# Patient Record
Sex: Female | Born: 1935
Health system: Southern US, Community
[De-identification: ages and names within clinical notes are randomized; demographics above are authoritative.]

## PROBLEM LIST (undated history)

## (undated) DIAGNOSIS — C801 Malignant (primary) neoplasm, unspecified: Secondary | ICD-10-CM

## (undated) DIAGNOSIS — M810 Age-related osteoporosis without current pathological fracture: Secondary | ICD-10-CM

## (undated) DIAGNOSIS — E039 Hypothyroidism, unspecified: Secondary | ICD-10-CM

## (undated) DIAGNOSIS — R0600 Dyspnea, unspecified: Secondary | ICD-10-CM

## (undated) DIAGNOSIS — E785 Hyperlipidemia, unspecified: Secondary | ICD-10-CM

## (undated) DIAGNOSIS — I10 Essential (primary) hypertension: Secondary | ICD-10-CM

## (undated) HISTORY — PX: EYE SURGERY: SHX253

## (undated) HISTORY — PX: CORNEAL CHELATION: SHX1399

---

## 2004-06-03 ENCOUNTER — Ambulatory Visit: Payer: Self-pay | Admitting: Internal Medicine

## 2005-06-09 ENCOUNTER — Ambulatory Visit: Payer: Self-pay | Admitting: Internal Medicine

## 2006-06-11 ENCOUNTER — Ambulatory Visit: Payer: Self-pay | Admitting: Internal Medicine

## 2007-06-15 ENCOUNTER — Ambulatory Visit: Payer: Self-pay | Admitting: Internal Medicine

## 2009-04-17 ENCOUNTER — Ambulatory Visit: Payer: Self-pay | Admitting: Internal Medicine

## 2010-04-24 ENCOUNTER — Ambulatory Visit: Payer: Self-pay | Admitting: Internal Medicine

## 2010-05-24 ENCOUNTER — Ambulatory Visit: Payer: Self-pay | Admitting: Internal Medicine

## 2010-06-13 ENCOUNTER — Ambulatory Visit: Payer: Self-pay | Admitting: Internal Medicine

## 2011-07-08 ENCOUNTER — Ambulatory Visit: Payer: Self-pay | Admitting: Internal Medicine

## 2012-01-14 ENCOUNTER — Other Ambulatory Visit: Payer: Self-pay | Admitting: Gastroenterology

## 2012-01-14 LAB — CLOSTRIDIUM DIFFICILE BY PCR

## 2012-03-19 ENCOUNTER — Ambulatory Visit: Payer: Self-pay | Admitting: Gastroenterology

## 2012-03-22 LAB — PATHOLOGY REPORT

## 2012-10-12 ENCOUNTER — Ambulatory Visit: Payer: Self-pay

## 2012-11-15 HISTORY — PX: TRIGGER FINGER RELEASE: SHX641

## 2013-09-22 DIAGNOSIS — K52839 Microscopic colitis, unspecified: Secondary | ICD-10-CM | POA: Insufficient documentation

## 2013-10-13 ENCOUNTER — Ambulatory Visit: Payer: Self-pay

## 2014-08-28 ENCOUNTER — Other Ambulatory Visit: Payer: Self-pay | Admitting: Infectious Diseases

## 2014-08-28 DIAGNOSIS — Z1231 Encounter for screening mammogram for malignant neoplasm of breast: Secondary | ICD-10-CM

## 2014-10-17 ENCOUNTER — Ambulatory Visit: Payer: Self-pay

## 2014-10-23 ENCOUNTER — Ambulatory Visit: Payer: Self-pay

## 2014-11-16 ENCOUNTER — Ambulatory Visit
Admission: RE | Admit: 2014-11-16 | Discharge: 2014-11-16 | Disposition: A | Discharge status: Home / Self Care | Payer: Medicare Other | Source: Ambulatory Visit | Attending: Infectious Diseases | Admitting: Infectious Diseases

## 2014-11-16 DIAGNOSIS — Z1231 Encounter for screening mammogram for malignant neoplasm of breast: Secondary | ICD-10-CM | POA: Diagnosis not present

## 2014-11-16 HISTORY — DX: Malignant (primary) neoplasm, unspecified: C80.1

## 2015-12-06 ENCOUNTER — Other Ambulatory Visit: Payer: Self-pay | Admitting: Infectious Diseases

## 2015-12-06 DIAGNOSIS — Z1231 Encounter for screening mammogram for malignant neoplasm of breast: Secondary | ICD-10-CM

## 2015-12-18 ENCOUNTER — Other Ambulatory Visit: Payer: Self-pay | Admitting: Infectious Diseases

## 2015-12-18 ENCOUNTER — Ambulatory Visit
Admission: RE | Admit: 2015-12-18 | Discharge: 2015-12-18 | Disposition: A | Discharge status: Home / Self Care | Payer: Medicare Other | Source: Ambulatory Visit | Attending: Infectious Diseases | Admitting: Infectious Diseases

## 2015-12-18 DIAGNOSIS — Z1231 Encounter for screening mammogram for malignant neoplasm of breast: Secondary | ICD-10-CM

## 2016-05-27 DIAGNOSIS — R300 Dysuria: Secondary | ICD-10-CM | POA: Diagnosis not present

## 2016-06-03 DIAGNOSIS — R300 Dysuria: Secondary | ICD-10-CM | POA: Diagnosis not present

## 2016-06-30 DIAGNOSIS — R1032 Left lower quadrant pain: Secondary | ICD-10-CM | POA: Diagnosis not present

## 2016-06-30 DIAGNOSIS — R1031 Right lower quadrant pain: Secondary | ICD-10-CM | POA: Diagnosis not present

## 2016-07-08 DIAGNOSIS — H401110 Primary open-angle glaucoma, right eye, stage unspecified: Secondary | ICD-10-CM | POA: Diagnosis not present

## 2016-08-25 DIAGNOSIS — K52839 Microscopic colitis, unspecified: Secondary | ICD-10-CM | POA: Diagnosis not present

## 2016-08-25 DIAGNOSIS — I10 Essential (primary) hypertension: Secondary | ICD-10-CM | POA: Diagnosis not present

## 2016-08-25 DIAGNOSIS — E039 Hypothyroidism, unspecified: Secondary | ICD-10-CM | POA: Diagnosis not present

## 2016-08-25 DIAGNOSIS — E7800 Pure hypercholesterolemia, unspecified: Secondary | ICD-10-CM | POA: Diagnosis not present

## 2016-09-01 DIAGNOSIS — R350 Frequency of micturition: Secondary | ICD-10-CM | POA: Diagnosis not present

## 2016-09-01 DIAGNOSIS — R300 Dysuria: Secondary | ICD-10-CM | POA: Diagnosis not present

## 2016-09-01 DIAGNOSIS — R3915 Urgency of urination: Secondary | ICD-10-CM | POA: Diagnosis not present

## 2016-09-01 DIAGNOSIS — R05 Cough: Secondary | ICD-10-CM | POA: Diagnosis not present

## 2016-12-08 DIAGNOSIS — I10 Essential (primary) hypertension: Secondary | ICD-10-CM | POA: Diagnosis not present

## 2016-12-08 DIAGNOSIS — E039 Hypothyroidism, unspecified: Secondary | ICD-10-CM | POA: Diagnosis not present

## 2017-01-05 ENCOUNTER — Other Ambulatory Visit: Payer: Self-pay | Admitting: Infectious Diseases

## 2017-01-05 DIAGNOSIS — Z1231 Encounter for screening mammogram for malignant neoplasm of breast: Secondary | ICD-10-CM

## 2017-01-26 ENCOUNTER — Ambulatory Visit
Admission: RE | Admit: 2017-01-26 | Discharge: 2017-01-26 | Disposition: A | Discharge status: Home / Self Care | Payer: Medicare Other | Source: Ambulatory Visit | Attending: Infectious Diseases | Admitting: Infectious Diseases

## 2017-01-26 DIAGNOSIS — E039 Hypothyroidism, unspecified: Secondary | ICD-10-CM | POA: Diagnosis not present

## 2017-01-26 DIAGNOSIS — I10 Essential (primary) hypertension: Secondary | ICD-10-CM | POA: Diagnosis not present

## 2017-01-26 DIAGNOSIS — Z1231 Encounter for screening mammogram for malignant neoplasm of breast: Secondary | ICD-10-CM

## 2017-02-12 DIAGNOSIS — H401110 Primary open-angle glaucoma, right eye, stage unspecified: Secondary | ICD-10-CM | POA: Diagnosis not present

## 2017-03-03 DIAGNOSIS — E039 Hypothyroidism, unspecified: Secondary | ICD-10-CM | POA: Diagnosis not present

## 2017-03-10 DIAGNOSIS — E7800 Pure hypercholesterolemia, unspecified: Secondary | ICD-10-CM | POA: Diagnosis not present

## 2017-03-10 DIAGNOSIS — I10 Essential (primary) hypertension: Secondary | ICD-10-CM | POA: Diagnosis not present

## 2017-03-10 DIAGNOSIS — Z0000 Encounter for general adult medical examination without abnormal findings: Secondary | ICD-10-CM | POA: Diagnosis not present

## 2017-03-10 DIAGNOSIS — E039 Hypothyroidism, unspecified: Secondary | ICD-10-CM | POA: Diagnosis not present

## 2017-03-19 HISTORY — PX: COLONOSCOPY: SHX174

## 2017-04-28 DIAGNOSIS — K580 Irritable bowel syndrome with diarrhea: Secondary | ICD-10-CM | POA: Diagnosis not present

## 2017-06-02 DIAGNOSIS — K529 Noninfective gastroenteritis and colitis, unspecified: Secondary | ICD-10-CM | POA: Diagnosis not present

## 2017-06-02 DIAGNOSIS — K580 Irritable bowel syndrome with diarrhea: Secondary | ICD-10-CM | POA: Diagnosis not present

## 2017-06-08 ENCOUNTER — Other Ambulatory Visit
Admission: RE | Admit: 2017-06-08 | Discharge: 2017-06-08 | Disposition: A | Discharge status: Home / Self Care | Payer: Medicare Other | Source: Ambulatory Visit | Attending: Gastroenterology | Admitting: Gastroenterology

## 2017-06-08 DIAGNOSIS — K529 Noninfective gastroenteritis and colitis, unspecified: Secondary | ICD-10-CM | POA: Insufficient documentation

## 2017-06-08 DIAGNOSIS — E039 Hypothyroidism, unspecified: Secondary | ICD-10-CM | POA: Diagnosis not present

## 2017-06-08 LAB — GASTROINTESTINAL PANEL BY PCR, STOOL (REPLACES STOOL CULTURE)
ADENOVIRUS F40/41: NOT DETECTED
ASTROVIRUS: NOT DETECTED
CRYPTOSPORIDIUM: NOT DETECTED
Campylobacter species: NOT DETECTED
Cyclospora cayetanensis: NOT DETECTED
ENTEROAGGREGATIVE E COLI (EAEC): NOT DETECTED
ENTEROPATHOGENIC E COLI (EPEC): NOT DETECTED
ENTEROTOXIGENIC E COLI (ETEC): NOT DETECTED
Entamoeba histolytica: NOT DETECTED
GIARDIA LAMBLIA: NOT DETECTED
NOROVIRUS GI/GII: NOT DETECTED
PLESIMONAS SHIGELLOIDES: NOT DETECTED
ROTAVIRUS A: NOT DETECTED
Salmonella species: NOT DETECTED
Sapovirus (I, II, IV, and V): NOT DETECTED
Shiga like toxin producing E coli (STEC): NOT DETECTED
Shigella/Enteroinvasive E coli (EIEC): NOT DETECTED
Vibrio cholerae: NOT DETECTED
Vibrio species: NOT DETECTED
Yersinia enterocolitica: NOT DETECTED

## 2017-06-08 LAB — C DIFFICILE QUICK SCREEN W PCR REFLEX
C DIFFICILE (CDIFF) INTERP: NOT DETECTED
C DIFFICILE (CDIFF) TOXIN: NEGATIVE
C Diff antigen: NEGATIVE

## 2017-06-16 DIAGNOSIS — I7300 Raynaud's syndrome without gangrene: Secondary | ICD-10-CM | POA: Diagnosis not present

## 2017-07-02 DIAGNOSIS — Q872 Congenital malformation syndromes predominantly involving limbs: Secondary | ICD-10-CM | POA: Diagnosis not present

## 2017-07-02 DIAGNOSIS — I7300 Raynaud's syndrome without gangrene: Secondary | ICD-10-CM | POA: Insufficient documentation

## 2017-07-02 DIAGNOSIS — R7982 Elevated C-reactive protein (CRP): Secondary | ICD-10-CM | POA: Diagnosis not present

## 2017-07-21 DIAGNOSIS — R634 Abnormal weight loss: Secondary | ICD-10-CM | POA: Diagnosis not present

## 2017-07-21 DIAGNOSIS — R197 Diarrhea, unspecified: Secondary | ICD-10-CM | POA: Diagnosis not present

## 2017-07-30 ENCOUNTER — Other Ambulatory Visit: Payer: Self-pay | Admitting: Gastroenterology

## 2017-07-30 DIAGNOSIS — R634 Abnormal weight loss: Secondary | ICD-10-CM

## 2017-07-30 DIAGNOSIS — Z87891 Personal history of nicotine dependence: Secondary | ICD-10-CM | POA: Diagnosis not present

## 2017-07-30 DIAGNOSIS — E079 Disorder of thyroid, unspecified: Secondary | ICD-10-CM

## 2017-07-30 DIAGNOSIS — K529 Noninfective gastroenteritis and colitis, unspecified: Secondary | ICD-10-CM | POA: Diagnosis not present

## 2017-08-04 ENCOUNTER — Ambulatory Visit: Payer: Medicare Other

## 2017-08-13 DIAGNOSIS — H401110 Primary open-angle glaucoma, right eye, stage unspecified: Secondary | ICD-10-CM | POA: Diagnosis not present

## 2017-08-20 ENCOUNTER — Ambulatory Visit
Admission: RE | Admit: 2017-08-20 | Discharge: 2017-08-20 | Disposition: A | Discharge status: Home / Self Care | Payer: Medicare Other | Source: Ambulatory Visit | Attending: Gastroenterology | Admitting: Gastroenterology

## 2017-08-20 DIAGNOSIS — N2889 Other specified disorders of kidney and ureter: Secondary | ICD-10-CM | POA: Diagnosis not present

## 2017-08-20 DIAGNOSIS — E079 Disorder of thyroid, unspecified: Secondary | ICD-10-CM

## 2017-08-20 DIAGNOSIS — R918 Other nonspecific abnormal finding of lung field: Secondary | ICD-10-CM | POA: Diagnosis not present

## 2017-08-20 DIAGNOSIS — I2510 Atherosclerotic heart disease of native coronary artery without angina pectoris: Secondary | ICD-10-CM | POA: Diagnosis not present

## 2017-08-20 DIAGNOSIS — J479 Bronchiectasis, uncomplicated: Secondary | ICD-10-CM | POA: Diagnosis not present

## 2017-08-20 DIAGNOSIS — J438 Other emphysema: Secondary | ICD-10-CM | POA: Diagnosis not present

## 2017-08-20 DIAGNOSIS — K529 Noninfective gastroenteritis and colitis, unspecified: Secondary | ICD-10-CM | POA: Insufficient documentation

## 2017-08-20 DIAGNOSIS — R634 Abnormal weight loss: Secondary | ICD-10-CM | POA: Diagnosis not present

## 2017-08-20 DIAGNOSIS — J439 Emphysema, unspecified: Secondary | ICD-10-CM | POA: Diagnosis not present

## 2017-08-20 DIAGNOSIS — R05 Cough: Secondary | ICD-10-CM | POA: Insufficient documentation

## 2017-08-20 DIAGNOSIS — Z87891 Personal history of nicotine dependence: Secondary | ICD-10-CM | POA: Diagnosis not present

## 2017-08-20 DIAGNOSIS — I700 Atherosclerosis of aorta: Secondary | ICD-10-CM | POA: Diagnosis not present

## 2017-08-20 DIAGNOSIS — J432 Centrilobular emphysema: Secondary | ICD-10-CM | POA: Diagnosis not present

## 2017-08-20 DIAGNOSIS — R197 Diarrhea, unspecified: Secondary | ICD-10-CM | POA: Diagnosis not present

## 2017-08-20 HISTORY — DX: Essential (primary) hypertension: I10

## 2017-08-20 MED ORDER — IOPAMIDOL (ISOVUE-300) INJECTION 61%
75.0000 mL | Freq: Once | INTRAVENOUS | Status: AC | PRN
  Administered 2017-08-20: 75 mL via INTRAVENOUS

## 2017-08-25 ENCOUNTER — Other Ambulatory Visit: Payer: Self-pay | Admitting: Gastroenterology

## 2017-08-25 DIAGNOSIS — N289 Disorder of kidney and ureter, unspecified: Secondary | ICD-10-CM

## 2017-08-27 ENCOUNTER — Ambulatory Visit
Admission: RE | Admit: 2017-08-27 | Discharge: 2017-08-27 | Disposition: A | Discharge status: Home / Self Care | Payer: Medicare Other | Source: Ambulatory Visit | Attending: Gastroenterology | Admitting: Gastroenterology

## 2017-08-27 ENCOUNTER — Encounter (INDEPENDENT_AMBULATORY_CARE_PROVIDER_SITE_OTHER): Payer: Self-pay

## 2017-08-27 DIAGNOSIS — N289 Disorder of kidney and ureter, unspecified: Secondary | ICD-10-CM | POA: Insufficient documentation

## 2017-08-27 DIAGNOSIS — N2889 Other specified disorders of kidney and ureter: Secondary | ICD-10-CM | POA: Diagnosis not present

## 2017-08-27 MED ORDER — GADOBENATE DIMEGLUMINE 529 MG/ML IV SOLN
8.0000 mL | Freq: Once | INTRAVENOUS | Status: AC | PRN
  Administered 2017-08-27: 8 mL via INTRAVENOUS

## 2017-09-04 DIAGNOSIS — I10 Essential (primary) hypertension: Secondary | ICD-10-CM | POA: Diagnosis not present

## 2017-09-04 DIAGNOSIS — E7800 Pure hypercholesterolemia, unspecified: Secondary | ICD-10-CM | POA: Diagnosis not present

## 2017-09-04 DIAGNOSIS — E039 Hypothyroidism, unspecified: Secondary | ICD-10-CM | POA: Diagnosis not present

## 2017-09-10 DIAGNOSIS — R634 Abnormal weight loss: Secondary | ICD-10-CM | POA: Diagnosis not present

## 2017-09-10 DIAGNOSIS — R197 Diarrhea, unspecified: Secondary | ICD-10-CM | POA: Diagnosis not present

## 2017-09-10 DIAGNOSIS — R194 Change in bowel habit: Secondary | ICD-10-CM | POA: Diagnosis not present

## 2017-09-11 DIAGNOSIS — J479 Bronchiectasis, uncomplicated: Secondary | ICD-10-CM | POA: Diagnosis not present

## 2017-09-11 DIAGNOSIS — R9389 Abnormal findings on diagnostic imaging of other specified body structures: Secondary | ICD-10-CM | POA: Diagnosis not present

## 2017-09-11 DIAGNOSIS — E039 Hypothyroidism, unspecified: Secondary | ICD-10-CM | POA: Diagnosis not present

## 2017-09-11 DIAGNOSIS — K52839 Microscopic colitis, unspecified: Secondary | ICD-10-CM | POA: Diagnosis not present

## 2017-09-18 DIAGNOSIS — R9389 Abnormal findings on diagnostic imaging of other specified body structures: Secondary | ICD-10-CM | POA: Diagnosis not present

## 2017-09-18 DIAGNOSIS — J479 Bronchiectasis, uncomplicated: Secondary | ICD-10-CM | POA: Diagnosis not present

## 2017-09-22 ENCOUNTER — Ambulatory Visit: Payer: Medicare Other | Admitting: Anesthesiology

## 2017-09-22 ENCOUNTER — Encounter
Admission: RE | Disposition: A | Discharge status: Home / Self Care | Payer: Self-pay | Source: Ambulatory Visit | Attending: Gastroenterology

## 2017-09-22 ENCOUNTER — Ambulatory Visit
Admission: RE | Admit: 2017-09-22 | Discharge: 2017-09-22 | Disposition: A | Discharge status: Home / Self Care | Payer: Medicare Other | Source: Ambulatory Visit | Attending: Gastroenterology | Admitting: Gastroenterology

## 2017-09-22 ENCOUNTER — Encounter: Payer: Self-pay | Admitting: *Deleted

## 2017-09-22 DIAGNOSIS — K2960 Other gastritis without bleeding: Secondary | ICD-10-CM | POA: Diagnosis not present

## 2017-09-22 DIAGNOSIS — K2210 Ulcer of esophagus without bleeding: Secondary | ICD-10-CM | POA: Insufficient documentation

## 2017-09-22 DIAGNOSIS — E7800 Pure hypercholesterolemia, unspecified: Secondary | ICD-10-CM | POA: Insufficient documentation

## 2017-09-22 DIAGNOSIS — Q872 Congenital malformation syndromes predominantly involving limbs: Secondary | ICD-10-CM | POA: Insufficient documentation

## 2017-09-22 DIAGNOSIS — K648 Other hemorrhoids: Secondary | ICD-10-CM | POA: Insufficient documentation

## 2017-09-22 DIAGNOSIS — E039 Hypothyroidism, unspecified: Secondary | ICD-10-CM | POA: Insufficient documentation

## 2017-09-22 DIAGNOSIS — K449 Diaphragmatic hernia without obstruction or gangrene: Secondary | ICD-10-CM | POA: Insufficient documentation

## 2017-09-22 DIAGNOSIS — Z9849 Cataract extraction status, unspecified eye: Secondary | ICD-10-CM | POA: Diagnosis not present

## 2017-09-22 DIAGNOSIS — K644 Residual hemorrhoidal skin tags: Secondary | ICD-10-CM | POA: Diagnosis not present

## 2017-09-22 DIAGNOSIS — Z882 Allergy status to sulfonamides status: Secondary | ICD-10-CM | POA: Insufficient documentation

## 2017-09-22 DIAGNOSIS — E785 Hyperlipidemia, unspecified: Secondary | ICD-10-CM | POA: Insufficient documentation

## 2017-09-22 DIAGNOSIS — M810 Age-related osteoporosis without current pathological fracture: Secondary | ICD-10-CM | POA: Diagnosis not present

## 2017-09-22 DIAGNOSIS — Z79899 Other long term (current) drug therapy: Secondary | ICD-10-CM | POA: Diagnosis not present

## 2017-09-22 DIAGNOSIS — K210 Gastro-esophageal reflux disease with esophagitis: Secondary | ICD-10-CM | POA: Diagnosis not present

## 2017-09-22 DIAGNOSIS — K52831 Collagenous colitis: Secondary | ICD-10-CM | POA: Insufficient documentation

## 2017-09-22 DIAGNOSIS — R197 Diarrhea, unspecified: Secondary | ICD-10-CM | POA: Diagnosis present

## 2017-09-22 DIAGNOSIS — I7300 Raynaud's syndrome without gangrene: Secondary | ICD-10-CM | POA: Insufficient documentation

## 2017-09-22 DIAGNOSIS — K2970 Gastritis, unspecified, without bleeding: Secondary | ICD-10-CM | POA: Diagnosis not present

## 2017-09-22 DIAGNOSIS — K5730 Diverticulosis of large intestine without perforation or abscess without bleeding: Secondary | ICD-10-CM | POA: Diagnosis not present

## 2017-09-22 DIAGNOSIS — K5790 Diverticulosis of intestine, part unspecified, without perforation or abscess without bleeding: Secondary | ICD-10-CM | POA: Diagnosis not present

## 2017-09-22 DIAGNOSIS — Q438 Other specified congenital malformations of intestine: Secondary | ICD-10-CM | POA: Insufficient documentation

## 2017-09-22 DIAGNOSIS — I10 Essential (primary) hypertension: Secondary | ICD-10-CM | POA: Insufficient documentation

## 2017-09-22 HISTORY — PX: ESOPHAGOGASTRODUODENOSCOPY (EGD) WITH PROPOFOL: SHX5813

## 2017-09-22 HISTORY — DX: Hypothyroidism, unspecified: E03.9

## 2017-09-22 HISTORY — DX: Dyspnea, unspecified: R06.00

## 2017-09-22 HISTORY — PX: COLONOSCOPY WITH PROPOFOL: SHX5780

## 2017-09-22 HISTORY — DX: Age-related osteoporosis without current pathological fracture: M81.0

## 2017-09-22 HISTORY — DX: Hyperlipidemia, unspecified: E78.5

## 2017-09-22 SURGERY — COLONOSCOPY WITH PROPOFOL
Anesthesia: General

## 2017-09-22 MED ORDER — EPHEDRINE SULFATE 50 MG/ML IJ SOLN
INTRAMUSCULAR | Status: DC | PRN
  Administered 2017-09-22: 10 mg via INTRAVENOUS

## 2017-09-22 MED ORDER — FENTANYL CITRATE (PF) 100 MCG/2ML IJ SOLN
INTRAMUSCULAR | Status: DC | PRN
  Administered 2017-09-22 (×2): 50 ug via INTRAVENOUS

## 2017-09-22 MED ORDER — SODIUM CHLORIDE 0.9 % IV SOLN
INTRAVENOUS | Status: DC
  Administered 2017-09-22: 08:00:00 via INTRAVENOUS

## 2017-09-22 MED ORDER — LIDOCAINE 2% (20 MG/ML) 5 ML SYRINGE
INTRAMUSCULAR | Status: DC | PRN
  Administered 2017-09-22: 30 mg via INTRAVENOUS

## 2017-09-22 MED ORDER — PHENYLEPHRINE HCL 10 MG/ML IJ SOLN
INTRAMUSCULAR | Status: DC | PRN
  Administered 2017-09-22: 100 ug via INTRAVENOUS

## 2017-09-22 MED ORDER — PROPOFOL 10 MG/ML IV BOLUS
INTRAVENOUS | Status: DC | PRN
  Administered 2017-09-22: 80 mg via INTRAVENOUS
  Administered 2017-09-22: 20 mg via INTRAVENOUS

## 2017-09-22 MED ORDER — FENTANYL CITRATE (PF) 100 MCG/2ML IJ SOLN
INTRAMUSCULAR | Status: AC
  Filled 2017-09-22: qty 2

## 2017-09-22 MED ORDER — PROPOFOL 500 MG/50ML IV EMUL
INTRAVENOUS | Status: AC
  Filled 2017-09-22: qty 50

## 2017-09-22 MED ORDER — EPHEDRINE SULFATE 50 MG/ML IJ SOLN
INTRAMUSCULAR | Status: AC
  Filled 2017-09-22: qty 1

## 2017-09-22 MED ORDER — LIDOCAINE HCL (PF) 2 % IJ SOLN
INTRAMUSCULAR | Status: AC
  Filled 2017-09-22: qty 10

## 2017-09-22 MED ORDER — LIDOCAINE HCL (PF) 1 % IJ SOLN
INTRAMUSCULAR | Status: AC
  Filled 2017-09-22: qty 2

## 2017-09-22 MED ORDER — PROPOFOL 500 MG/50ML IV EMUL
INTRAVENOUS | Status: DC | PRN
  Administered 2017-09-22: 130 ug/kg/min via INTRAVENOUS

## 2017-09-22 MED ORDER — PROPOFOL 10 MG/ML IV BOLUS
INTRAVENOUS | Status: AC
  Filled 2017-09-22: qty 20

## 2017-09-22 NOTE — Anesthesia Post-op Follow-up Note (Signed)
Anesthesia QCDR form completed.        

## 2017-09-22 NOTE — Anesthesia Preprocedure Evaluation (Signed)
Anesthesia Evaluation  Patient identified by MRN, date of birth, ID band Patient awake    Reviewed: Allergy & Precautions, H&P , NPO status , Patient's Chart, lab work & pertinent test results, reviewed documented beta blocker date and time   Airway Mallampati: II   Neck ROM: full    Dental  (+) Teeth Intact   Pulmonary neg pulmonary ROS, shortness of breath and with exertion, former smoker,    Pulmonary exam normal        Cardiovascular Exercise Tolerance: Poor hypertension, On Medications negative cardio ROS Normal cardiovascular exam Rhythm:regular Rate:Normal     Neuro/Psych negative neurological ROS  negative psych ROS   GI/Hepatic negative GI ROS, Neg liver ROS,   Endo/Other  negative endocrine ROSHypothyroidism   Renal/GU negative Renal ROS  negative genitourinary   Musculoskeletal   Abdominal   Peds  Hematology negative hematology ROS (+)   Anesthesia Other Findings Past Medical History: No date: Cancer ([place])     Comment:  Skin CA resected from Left leg.  No date: Dyspnea No date: Hyperlipidemia No date: Hypertension No date: Hypothyroidism No date: Osteoporosis Past Surgical History: 03/19/2017: COLONOSCOPY No date: CORNEAL CHELATION     Comment:  detached retina No date: EYE SURGERY     Comment:  cataract 11/15/2012: TRIGGER FINGER RELEASE; Right   Reproductive/Obstetrics negative OB ROS                             Anesthesia Physical Anesthesia Plan  ASA: III  Anesthesia Plan: General   Post-op Pain Management:    Induction:   PONV Risk Score and Plan:   Airway Management Planned:   Additional Equipment:   Intra-op Plan:   Post-operative Plan:   Informed Consent: I have reviewed the patients History and Physical, chart, labs and discussed the procedure including the risks, benefits and alternatives for the proposed anesthesia with the patient or  authorized representative who has indicated his/her understanding and acceptance.   Dental Advisory Given  Plan Discussed with: CRNA  Anesthesia Plan Comments:         Anesthesia Quick Evaluation

## 2017-09-22 NOTE — H&P (Signed)
Outpatient short stay form Pre-procedure 09/22/2017 8:48 AM Kelsey Quinn  Primary Physician: [name], Quinn  Reason for visit: EGD and colonoscopy  History of present illness: Patient is a 82 year old female presenting today for further evaluation in regards to weight loss change in bowel habits with diarrhea.  She denies any problems with nausea vomiting or abdominal pain.  There is no dysphagia or odontophagia.  There is no heartburn or reflux symptoms.  He has been                    having issues with                                                                                                                unstable thyroid function                                                                                                                            .                             sHe does have a history of early microscopic colitis in 2013.  She does take a probiotic.  She is presenting today for further evaluation.  Her diarrhea is somewhat improved with the use of Imodium and Bentyl.  Is on nutritional supplements.  She tolerated her prep well.  She takes no aspirin or blood thinning agent.    Current Facility-Administered Medications:  .  0.9 %  sodium chloride infusion, , Intravenous, Continuous, Kelsey Sails, Quinn .  lidocaine (PF) (XYLOCAINE) 1 % injection, , , ,   Medications Prior to Admission  Medication Sig Dispense Refill Last Dose  . Calcium Carb-Cholecalciferol (CALCIUM-VITAMIN D) 500-400 MG-UNIT TABS Take 1 tablet by mouth daily.     Marland Kitchen dicyclomine (BENTYL) 10 MG capsule Take 10 mg by mouth 3 (three) times daily as needed for spasms.     Marland Kitchen GLUCOSAMINE-CHONDROIT-VIT C-MN PO Take 1 tablet by mouth daily.     Marland Kitchen levothyroxine (SYNTHROID, LEVOTHROID) 100 MCG tablet Take 100 mcg by mouth daily before breakfast.   09/22/2017 at Unknown time  . losartan (COZAAR) 50 MG tablet Take 50 mg by mouth daily.   09/22/2017 at Unknown time  . Melatonin 3 MG TABS Take 1  tablet by mouth as needed.     . Multiple Vitamins-Minerals (CENTRUM SILVER PO) Take 1 tablet by mouth.     Marland Kitchen  Potassium Chloride ER 20 MEQ TBCR Take 10 mEq by mouth daily.     . Probiotic Product (ALIGN) 4 MG CAPS Take 1 capsule by mouth daily.     . timolol (BETIMOL) 0.5 % ophthalmic solution Place 1 drop into both eyes 2 (two) times daily.        Allergies  Allergen Reactions  . Sulfa Antibiotics Rash     Past Medical History:  Diagnosis Date  . Cancer ([place])    Skin CA resected from Left leg.   Marland Kitchen Dyspnea   . Hyperlipidemia   . Hypertension   . Hypothyroidism   . Osteoporosis     Review of systems:      Physical Exam    Heart and lungs: The rate and rhythm without rub or gallop, lungs are bilaterally clear.    HEENT: Normocephalic atraumatic eyes are anicteric.  Note medial corneal scarring on the right.  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  There is marked arcus senilis.    Other:    Pertinant exam for procedure: Soft nontender nondistended bowel sounds positive normoactive    Planned proceedures: EGD, colonoscopy and indicated procedures. I have discussed the risks benefits and complications of procedures to include not limited to bleeding, infection, perforation and the risk of sedation and the patient wishes to proceed.    Kelsey Sails, Quinn [facility] 09/22/2017  8:48 AM

## 2017-09-22 NOTE — Op Note (Signed)
[facility]astroenter[facility] Patient Name: Kelsey Quinn Procedure Date: 09/22/2017 8:30 AM MRN: [id number] Account #: [id number] Date of Birth: [birth year] Admit Type: Outpatient Age: 82 Room: [facility] ENDO ROOM 3 Gender: Female Note Status: Finalized Procedure:            Upper GI endoscopy Indications:          Weight loss Providers:            Lollie Sails, MD Medicines:            Monitored Anesthesia Care Complications:        No immediate complications. Procedure:            Pre-Anesthesia Assessment:                       - ASA Grade Assessment: III - A patient with severe                        systemic disease.                       After obtaining informed consent, the endoscope was                        passed under direct vision. Throughout the procedure,                        the patient's blood pressure, pulse, and oxygen                        saturations were monitored continuously. The Endoscope                        was introduced through the mouth, and advanced to the                        third part of duodenum. The upper GI endoscopy was                        accomplished without difficulty. The patient tolerated                        the procedure well. Findings:      LA Grade B (one or more mucosal breaks greater than 5 mm, not extending       between the tops of two mucosal folds) esophagitis with no bleeding was       found. Biopsies were taken with a cold forceps for histology.      Diffuse and patchy mild inflammation characterized by adherent blood,       congestion (edema), erythema and granularity was found in the gastric       antrum. Biopsies were taken with a cold forceps for histology.      Normal mucosa was found in the entire duodenum. The pathway of the       duodenum was tortuosu. Biopsies were taken with a cold forceps for       histology.      I was unable to fully expand the upper stomach due to loss of   insufflation with incomplete retroflex evaluation of the fundus.      A small hiatal hernia was present. Impression:           -  LA Grade B erosive esophagitis. Biopsied.                       - Erosive gastritis. Biopsied.                       - Normal mucosa was found in the entire examined                        duodenum. Biopsied. Recommendation:       - Use Aciphex (rabeprazole) 20 mg PO daily daily.                       - Await pathology results.                       - Perform an upper GI series and small bowel follow                        through at appointment to be scheduled. Procedure Code(s):    --- Professional ---                       [phone], Esophagogastroduodenoscopy, flexible, transoral;                        with biopsy, single or multiple Diagnosis Code(s):    --- Professional ---                       K20.8, Other esophagitis                       K29.60, Other gastritis without bleeding                       R63.4, Abnormal weight loss CPT copyright 2017 American Medical Association. All rights reserved. The codes documented in this report are preliminary and upon coder review may  be revised to meet current compliance requirements. Lollie Sails, MD 09/22/2017 9:20:02 AM This report has been signed electronically. Number of Addenda: 0 Note Initiated On: 09/22/2017 8:30 AM      [facility]

## 2017-09-22 NOTE — Transfer of Care (Signed)
Immediate Anesthesia Transfer of Care Note  Patient: Kelsey Quinn  Procedure(s) Performed: COLONOSCOPY WITH PROPOFOL (N/A ) ESOPHAGOGASTRODUODENOSCOPY (EGD) WITH PROPOFOL (N/A )  Patient Location: PACU and Endoscopy Unit  Anesthesia Type:General  Level of Consciousness: sedated  Airway & Oxygen Therapy: Patient Spontanous Breathing and Patient connected to nasal cannula oxygen  Post-op Assessment: Report given to RN and Post -op Vital signs reviewed and stable  Post vital signs: Reviewed and stable  Last Vitals:  Vitals Value Taken Time  BP 112/54 09/22/2017  9:57 AM  Temp 36.1 C 09/22/2017  9:50 AM  Pulse 81 09/22/2017  9:58 AM  Resp 9 09/22/2017  9:58 AM  SpO2 100 % 09/22/2017  9:58 AM  Vitals shown include unvalidated device data.  Last Pain:  Vitals:   09/22/17 0950  TempSrc: Tympanic  PainSc:          Complications: No apparent anesthesia complications

## 2017-09-22 NOTE — Op Note (Signed)
[facility]astroenterology Patient Name: Kelsey Quinn Procedure Date: 09/22/2017 8:30 AM MRN: [id number] Account #: [id number] Date of Birth: Aug 18, 1935 Admit Type: Outpatient Age: 82 Room: [facility] ENDO ROOM 3 Gender: Female Note Status: Finalized Procedure:            Colonoscopy Indications:          Clinically significant diarrhea of unexplained origin,                        Weight loss Providers:            Lollie Sails, MD Medicines:            Monitored Anesthesia Care Complications:        No immediate complications. Procedure:            Pre-Anesthesia Assessment:                       - ASA Grade Assessment: III - A patient with severe                        systemic disease.                       After obtaining informed consent, the colonoscope was                        passed under direct vision. Throughout the procedure,                        the patient's blood pressure, pulse, and oxygen                        saturations were monitored continuously. The Olympus                        PCF-H180AL colonoscope ( S#: [id number] ) was introduced                        through the anus and advanced to the the cecum,                        identified by appendiceal orifice and ileocecal valve.                        The colonoscopy was unusually difficult due to a                        redundant colon. Successful completion of the procedure                        was aided by changing the patient to a supine position,                        changing the patient to a prone position and using                        manual pressure. The patient tolerated the procedure                        well. The quality of the  bowel preparation was good. Findings:      The colon (entire examined portion) was significantly redundant.      Multiple medium-mouthed diverticula were found in the sigmoid colon,       descending colon, transverse colon and distal  transverse colon.      The exam was otherwise normal throughout the examined colon.      Biopsies for histology were taken with a cold forceps from the right       colon and left colon for evaluation of microscopic colitis.      The perianal exam findings include non-thrombosed external hemorrhoids.      The digital rectal exam was normal. Impression:           - Redundant colon.                       - Diverticulosis in the sigmoid colon, in the                        descending colon, in the transverse colon and in the                        distal transverse colon.                       - Non-thrombosed external hemorrhoids found on perianal                        exam.                       - Biopsies were taken with a cold forceps from the                        right colon and left colon for evaluation of                        microscopic colitis. Recommendation:       - Discharge patient to home.                       - sitz baths bid for several days.                       - Use Analpram HC Cream 2.5%: Apply externally TID for                        10 days. Procedure Code(s):    --- Professional ---                       [phone], Colonoscopy, flexible; with biopsy, single or                        multiple Diagnosis Code(s):    --- Professional ---                       K64.4, Residual hemorrhoidal skin tags                       R19.7, Diarrhea, unspecified  R63.4, Abnormal weight loss                       K57.30, Diverticulosis of large intestine without                        perforation or abscess without bleeding                       Q43.8, Other specified congenital malformations of                        intestine CPT copyright 2017 American Medical Association. All rights reserved. The codes documented in this report are preliminary and upon coder review may  be revised to meet current compliance requirements. Lollie Sails, MD 09/22/2017  9:55:07 AM This report has been signed electronically. Number of Addenda: 0 Note Initiated On: 09/22/2017 8:30 AM Scope Withdrawal Time: 0 hours 7 minutes 44 seconds  Total Procedure Duration: 0 hours 27 minutes 47 seconds       [facility]

## 2017-09-23 ENCOUNTER — Encounter: Payer: Self-pay | Admitting: Gastroenterology

## 2017-09-24 LAB — SURGICAL PATHOLOGY

## 2017-10-01 NOTE — Anesthesia Postprocedure Evaluation (Signed)
Anesthesia Post Note  Patient: Kelsey Quinn  Procedure(s) Performed: COLONOSCOPY WITH PROPOFOL (N/A ) ESOPHAGOGASTRODUODENOSCOPY (EGD) WITH PROPOFOL (N/A )  Patient location during evaluation: PACU Anesthesia Type: General Level of consciousness: awake and alert Pain management: pain level controlled Vital Signs Assessment: post-procedure vital signs reviewed and stable Respiratory status: spontaneous breathing, nonlabored ventilation, respiratory function stable and patient connected to nasal cannula oxygen Cardiovascular status: blood pressure returned to baseline and stable Postop Assessment: no apparent nausea or vomiting Anesthetic complications: no     Last Vitals:  Vitals:   09/22/17 1020 09/22/17 1030  BP: (!) 87/50 (!) 112/53  Pulse: 93 87  Resp: 18 17  Temp:    SpO2: (!) 79% 95%    Last Pain:  Vitals:   09/22/17 0950  TempSrc: Tympanic  PainSc:                  Molli Barrows

## 2017-10-13 DIAGNOSIS — K52831 Collagenous colitis: Secondary | ICD-10-CM | POA: Diagnosis not present

## 2017-10-13 DIAGNOSIS — K219 Gastro-esophageal reflux disease without esophagitis: Secondary | ICD-10-CM | POA: Diagnosis not present

## 2017-10-13 DIAGNOSIS — R197 Diarrhea, unspecified: Secondary | ICD-10-CM | POA: Diagnosis not present

## 2017-10-14 DIAGNOSIS — I7300 Raynaud's syndrome without gangrene: Secondary | ICD-10-CM | POA: Diagnosis not present

## 2017-10-14 DIAGNOSIS — Q872 Congenital malformation syndromes predominantly involving limbs: Secondary | ICD-10-CM | POA: Diagnosis not present

## 2017-11-12 ENCOUNTER — Ambulatory Visit: Admit: 2017-11-12 | Payer: Medicare Other | Admitting: Gastroenterology

## 2017-11-12 SURGERY — COLONOSCOPY WITH PROPOFOL
Anesthesia: General

## 2017-11-26 DIAGNOSIS — R634 Abnormal weight loss: Secondary | ICD-10-CM | POA: Diagnosis not present

## 2017-11-26 DIAGNOSIS — K210 Gastro-esophageal reflux disease with esophagitis: Secondary | ICD-10-CM | POA: Diagnosis not present

## 2017-11-26 DIAGNOSIS — K52831 Collagenous colitis: Secondary | ICD-10-CM | POA: Diagnosis not present

## 2017-12-15 DIAGNOSIS — E039 Hypothyroidism, unspecified: Secondary | ICD-10-CM | POA: Diagnosis not present

## 2017-12-21 DIAGNOSIS — I10 Essential (primary) hypertension: Secondary | ICD-10-CM | POA: Diagnosis not present

## 2017-12-21 DIAGNOSIS — E039 Hypothyroidism, unspecified: Secondary | ICD-10-CM | POA: Diagnosis not present

## 2017-12-21 DIAGNOSIS — E7800 Pure hypercholesterolemia, unspecified: Secondary | ICD-10-CM | POA: Diagnosis not present

## 2017-12-21 DIAGNOSIS — K52839 Microscopic colitis, unspecified: Secondary | ICD-10-CM | POA: Diagnosis not present

## 2017-12-21 DIAGNOSIS — J479 Bronchiectasis, uncomplicated: Secondary | ICD-10-CM | POA: Diagnosis not present

## 2017-12-21 DIAGNOSIS — R9389 Abnormal findings on diagnostic imaging of other specified body structures: Secondary | ICD-10-CM | POA: Diagnosis not present

## 2018-01-15 ENCOUNTER — Other Ambulatory Visit: Payer: Self-pay | Admitting: Internal Medicine

## 2018-01-15 DIAGNOSIS — Z1231 Encounter for screening mammogram for malignant neoplasm of breast: Secondary | ICD-10-CM

## 2018-01-21 DIAGNOSIS — K219 Gastro-esophageal reflux disease without esophagitis: Secondary | ICD-10-CM | POA: Diagnosis not present

## 2018-01-21 DIAGNOSIS — K52831 Collagenous colitis: Secondary | ICD-10-CM | POA: Diagnosis not present

## 2018-01-21 DIAGNOSIS — E039 Hypothyroidism, unspecified: Secondary | ICD-10-CM | POA: Diagnosis not present

## 2018-02-11 DIAGNOSIS — H401110 Primary open-angle glaucoma, right eye, stage unspecified: Secondary | ICD-10-CM | POA: Diagnosis not present

## 2018-02-18 DIAGNOSIS — H401110 Primary open-angle glaucoma, right eye, stage unspecified: Secondary | ICD-10-CM | POA: Diagnosis not present

## 2018-02-22 ENCOUNTER — Ambulatory Visit
Admission: RE | Admit: 2018-02-22 | Discharge: 2018-02-22 | Disposition: A | Discharge status: Home / Self Care | Payer: Medicare Other | Source: Ambulatory Visit | Attending: Internal Medicine | Admitting: Internal Medicine

## 2018-02-22 DIAGNOSIS — Z1231 Encounter for screening mammogram for malignant neoplasm of breast: Secondary | ICD-10-CM | POA: Insufficient documentation

## 2018-03-18 DIAGNOSIS — E039 Hypothyroidism, unspecified: Secondary | ICD-10-CM | POA: Diagnosis not present

## 2018-03-18 DIAGNOSIS — E7800 Pure hypercholesterolemia, unspecified: Secondary | ICD-10-CM | POA: Diagnosis not present

## 2018-03-18 DIAGNOSIS — Z0000 Encounter for general adult medical examination without abnormal findings: Secondary | ICD-10-CM | POA: Diagnosis not present

## 2018-03-18 DIAGNOSIS — I10 Essential (primary) hypertension: Secondary | ICD-10-CM | POA: Diagnosis not present

## 2018-04-29 DIAGNOSIS — Q872 Congenital malformation syndromes predominantly involving limbs: Secondary | ICD-10-CM | POA: Diagnosis not present

## 2018-04-29 DIAGNOSIS — I7300 Raynaud's syndrome without gangrene: Secondary | ICD-10-CM | POA: Diagnosis not present

## 2018-06-04 DIAGNOSIS — S8992XA Unspecified injury of left lower leg, initial encounter: Secondary | ICD-10-CM | POA: Diagnosis not present

## 2018-06-04 DIAGNOSIS — M25562 Pain in left knee: Secondary | ICD-10-CM | POA: Diagnosis not present

## 2018-06-04 DIAGNOSIS — S4992XA Unspecified injury of left shoulder and upper arm, initial encounter: Secondary | ICD-10-CM | POA: Diagnosis not present

## 2018-06-04 DIAGNOSIS — W101XXA Fall (on)(from) sidewalk curb, initial encounter: Secondary | ICD-10-CM | POA: Diagnosis not present

## 2018-06-10 DIAGNOSIS — M19012 Primary osteoarthritis, left shoulder: Secondary | ICD-10-CM | POA: Diagnosis not present

## 2018-06-10 DIAGNOSIS — M25512 Pain in left shoulder: Secondary | ICD-10-CM | POA: Diagnosis not present

## 2018-06-10 DIAGNOSIS — M75122 Complete rotator cuff tear or rupture of left shoulder, not specified as traumatic: Secondary | ICD-10-CM | POA: Diagnosis not present

## 2018-06-10 DIAGNOSIS — M12812 Other specific arthropathies, not elsewhere classified, left shoulder: Secondary | ICD-10-CM | POA: Diagnosis not present

## 2018-06-14 DIAGNOSIS — R8290 Unspecified abnormal findings in urine: Secondary | ICD-10-CM | POA: Diagnosis not present

## 2018-06-14 DIAGNOSIS — I10 Essential (primary) hypertension: Secondary | ICD-10-CM | POA: Diagnosis not present

## 2018-06-14 DIAGNOSIS — E039 Hypothyroidism, unspecified: Secondary | ICD-10-CM | POA: Diagnosis not present

## 2018-06-14 DIAGNOSIS — E7800 Pure hypercholesterolemia, unspecified: Secondary | ICD-10-CM | POA: Diagnosis not present

## 2018-06-21 DIAGNOSIS — I10 Essential (primary) hypertension: Secondary | ICD-10-CM | POA: Diagnosis not present

## 2018-06-21 DIAGNOSIS — E039 Hypothyroidism, unspecified: Secondary | ICD-10-CM | POA: Diagnosis not present

## 2018-06-21 DIAGNOSIS — I7300 Raynaud's syndrome without gangrene: Secondary | ICD-10-CM | POA: Diagnosis not present

## 2018-06-21 DIAGNOSIS — E7800 Pure hypercholesterolemia, unspecified: Secondary | ICD-10-CM | POA: Diagnosis not present

## 2018-09-21 DIAGNOSIS — E039 Hypothyroidism, unspecified: Secondary | ICD-10-CM | POA: Diagnosis not present

## 2018-10-12 DIAGNOSIS — K52831 Collagenous colitis: Secondary | ICD-10-CM | POA: Diagnosis not present

## 2018-10-12 DIAGNOSIS — K210 Gastro-esophageal reflux disease with esophagitis: Secondary | ICD-10-CM | POA: Diagnosis not present

## 2018-10-21 DIAGNOSIS — H353112 Nonexudative age-related macular degeneration, right eye, intermediate dry stage: Secondary | ICD-10-CM | POA: Diagnosis not present

## 2018-10-28 DIAGNOSIS — Q872 Congenital malformation syndromes predominantly involving limbs: Secondary | ICD-10-CM | POA: Diagnosis not present

## 2018-10-28 DIAGNOSIS — I7300 Raynaud's syndrome without gangrene: Secondary | ICD-10-CM | POA: Diagnosis not present

## 2018-12-16 DIAGNOSIS — I10 Essential (primary) hypertension: Secondary | ICD-10-CM | POA: Diagnosis not present

## 2018-12-16 DIAGNOSIS — E039 Hypothyroidism, unspecified: Secondary | ICD-10-CM | POA: Diagnosis not present

## 2018-12-23 DIAGNOSIS — Z79899 Other long term (current) drug therapy: Secondary | ICD-10-CM | POA: Diagnosis not present

## 2018-12-23 DIAGNOSIS — I10 Essential (primary) hypertension: Secondary | ICD-10-CM | POA: Diagnosis not present

## 2018-12-23 DIAGNOSIS — E7800 Pure hypercholesterolemia, unspecified: Secondary | ICD-10-CM | POA: Diagnosis not present

## 2018-12-23 DIAGNOSIS — E039 Hypothyroidism, unspecified: Secondary | ICD-10-CM | POA: Diagnosis not present

## 2019-02-28 IMAGING — MG 2D DIGITAL SCREENING BILATERAL MAMMOGRAM WITH CAD AND ADJUNCT TO
9 of 13 series · 9 of 29 positions shown · non-contrast
Comparison: Previous exam(s).

CLINICAL DATA: Screening.

EXAM:
2D DIGITAL SCREENING BILATERAL MAMMOGRAM WITH CAD AND ADJUNCT TOMO

[L CV]
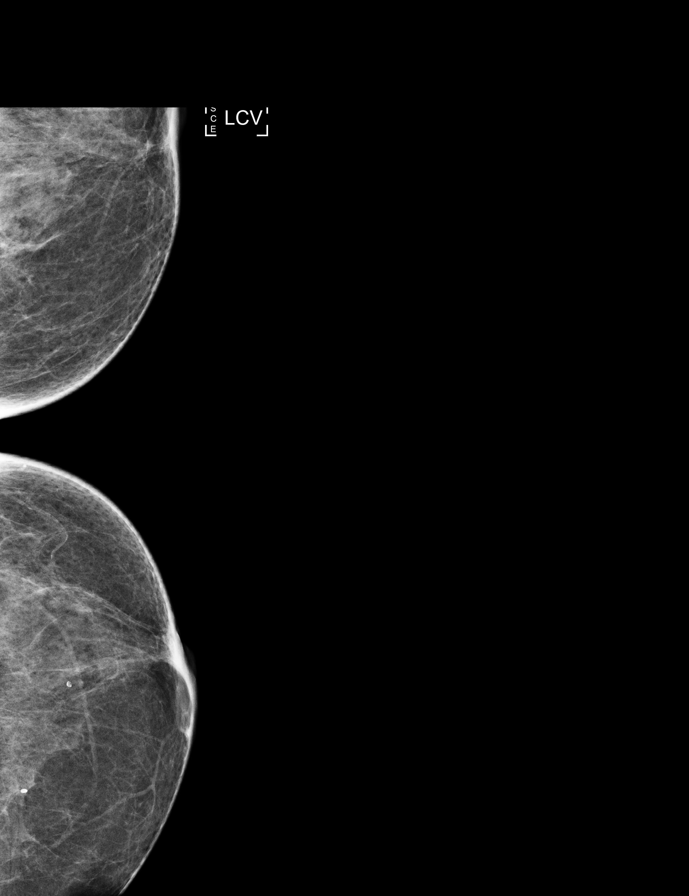

[L MLO synth-2D]
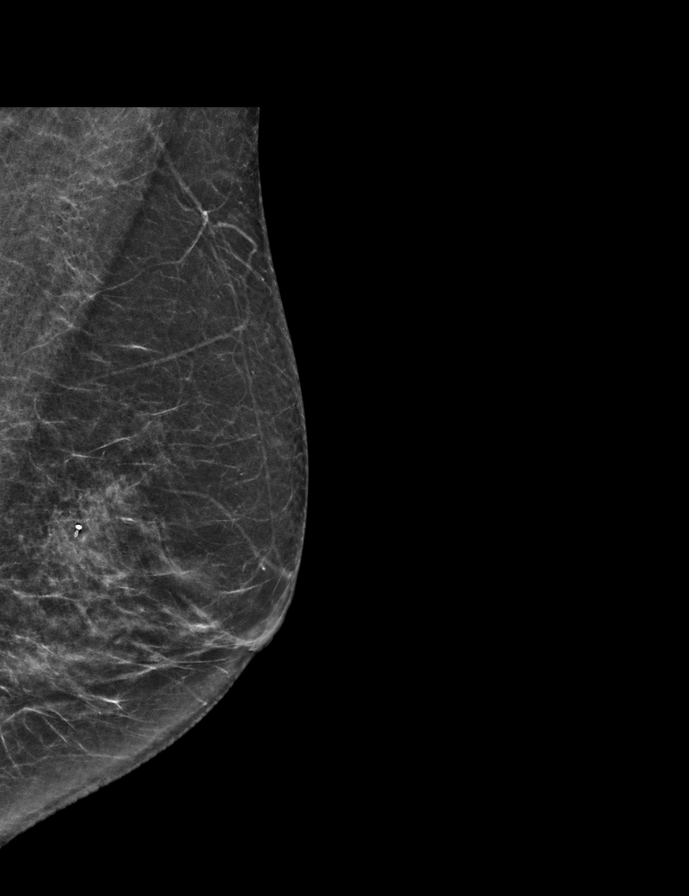

[L CC synth-2D]
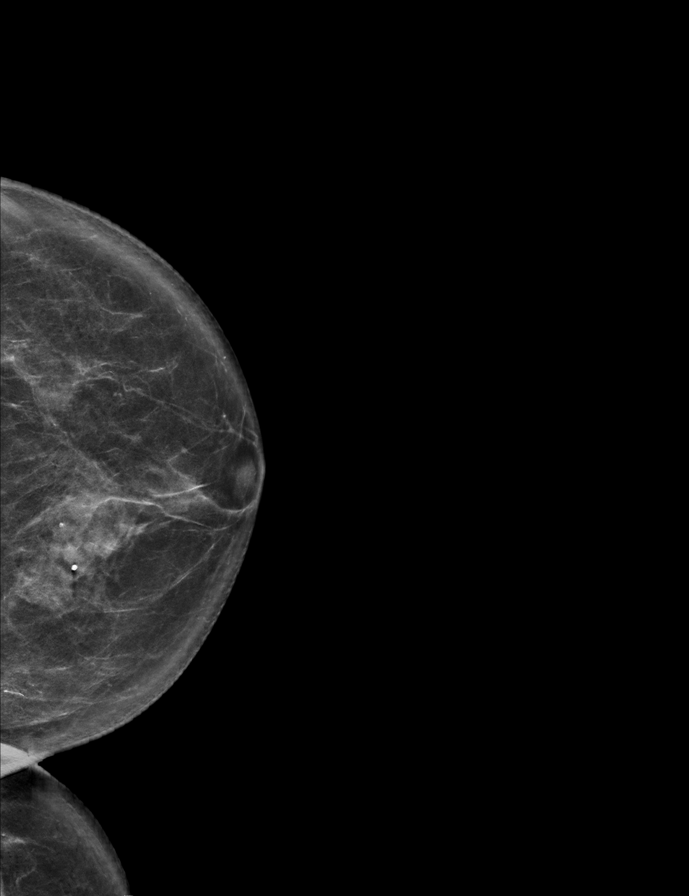

[L CC]
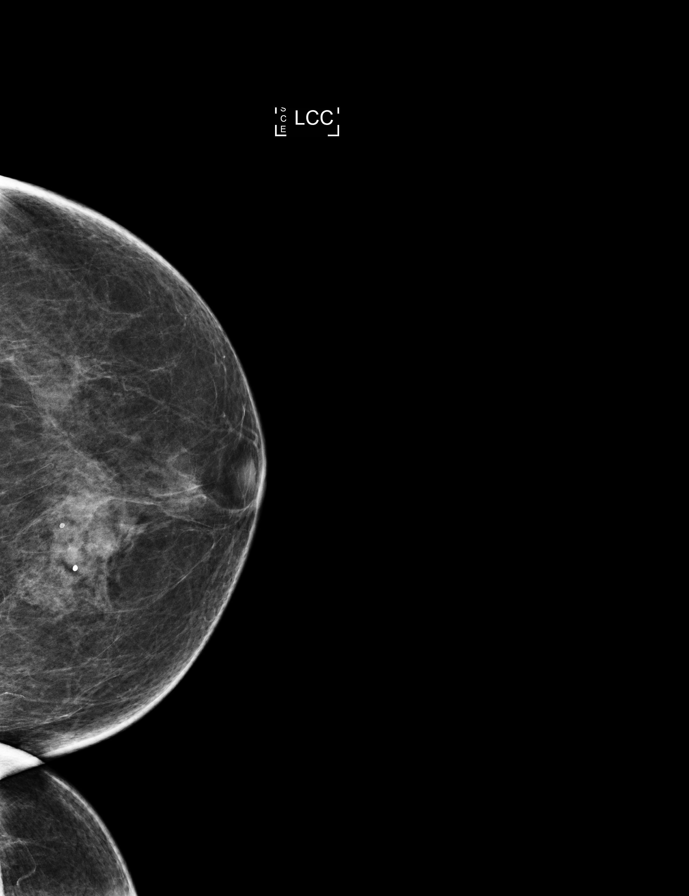

[R CC]
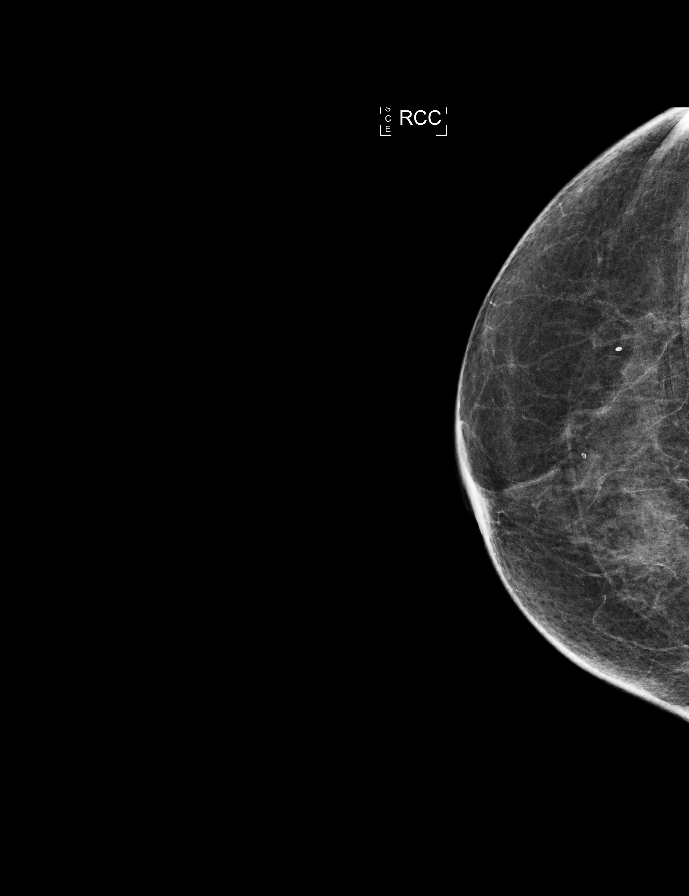

[R MLO synth-2D]
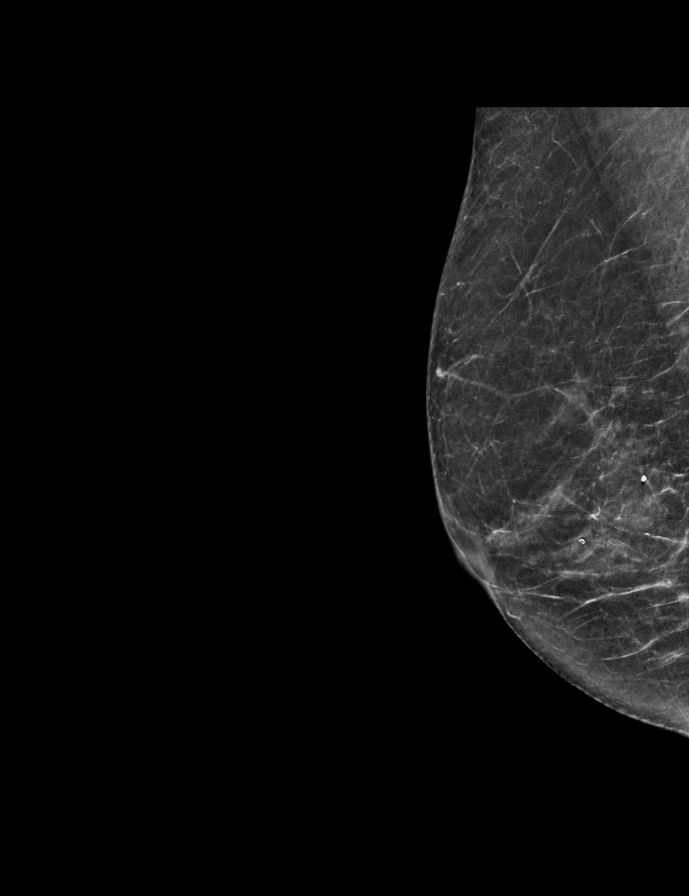

[R MLO]
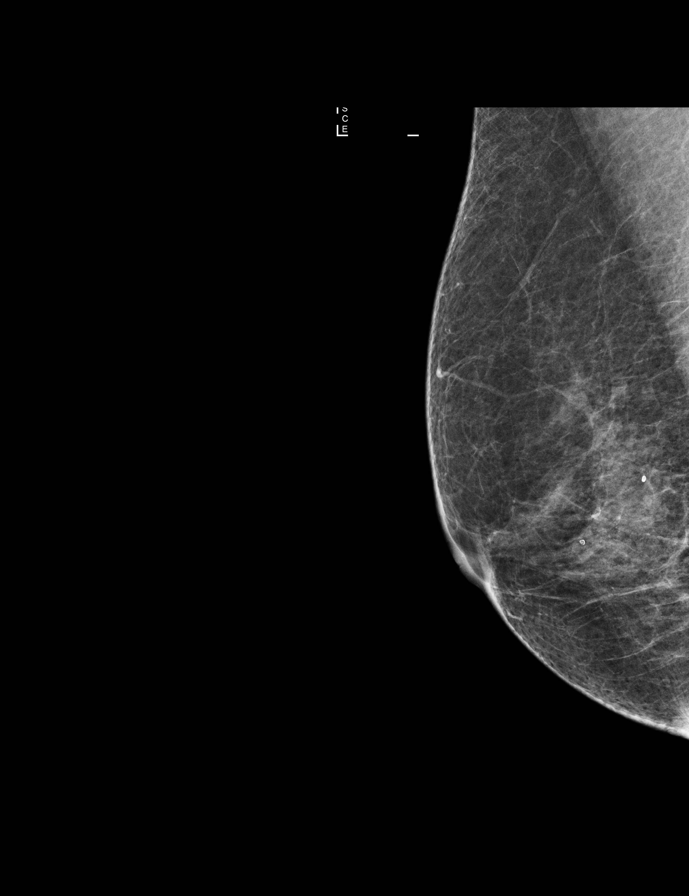

[L MLO]
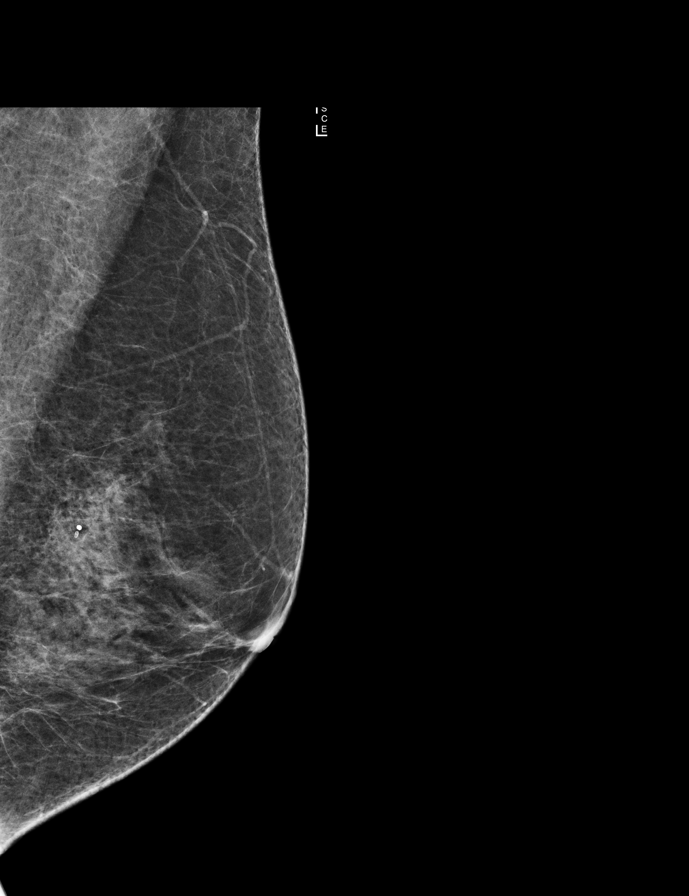

[R CC synth-2D]
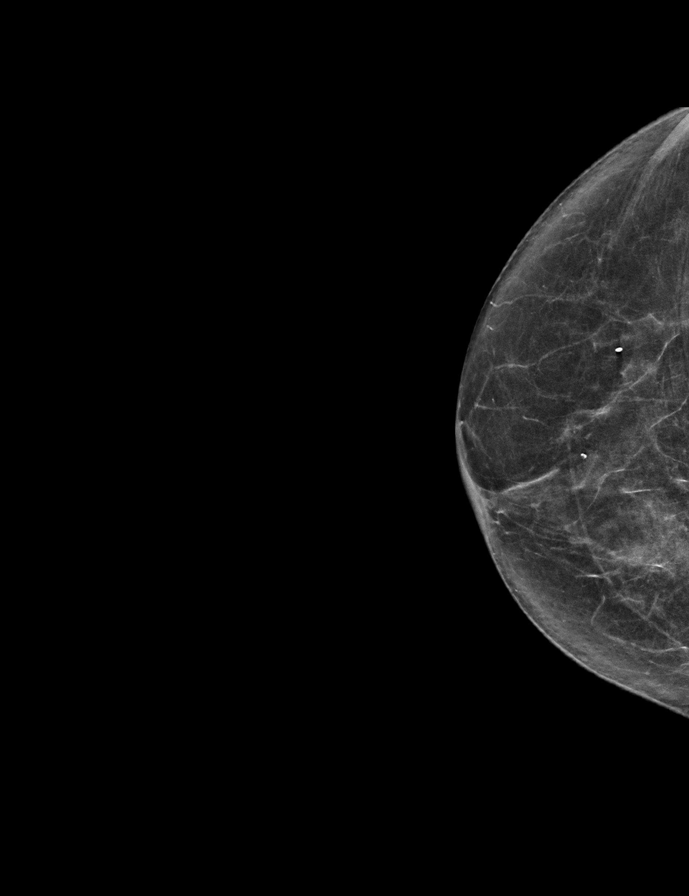

[9 of 29 positions shown; findings below may reference images not displayed]

ACR Breast Density Category b: There are scattered areas of
fibroglandular density.
FINDINGS: There are no findings suspicious for malignancy. Images were
processed with CAD.
IMPRESSION: No mammographic evidence of malignancy. A result letter of this
screening mammogram will be mailed directly to the patient.

RECOMMENDATION:
Screening mammogram in one year. (Code:97-6-RS4)

BI-RADS CATEGORY  1: Negative.

## 2019-07-10 ENCOUNTER — Ambulatory Visit: Payer: Medicare Other | Attending: Internal Medicine

## 2019-07-10 DIAGNOSIS — Z23 Encounter for immunization: Secondary | ICD-10-CM

## 2019-07-10 NOTE — Progress Notes (Signed)
   Covid-19 Vaccination Clinic  Name:  Kelsey Quinn    MRN: [id number] DOB: [birth year]  07/10/2019  Kelsey Quinn was observed post Covid-19 immunization for 15 minutes without incident. She was provided with Vaccine Information Sheet and instruction to access the V-Safe system.   Kelsey Quinn was instructed to call 911 with any severe reactions post vaccine: Marland Kitchen Difficulty breathing  . Swelling of face and throat  . A fast heartbeat  . A bad rash all over body  . Dizziness and weakness   Immunizations Administered    Name Date Dose VIS Date Route   Pfizer COVID-19 Vaccine 07/10/2019  9:07 AM 0.3 mL 03/25/2019 Intramuscular   Manufacturer: [place]   Lot: [id number]   [place]: [id number]

## 2019-08-02 ENCOUNTER — Ambulatory Visit: Payer: Medicare Other | Attending: Internal Medicine

## 2019-08-02 DIAGNOSIS — Z23 Encounter for immunization: Secondary | ICD-10-CM

## 2019-08-02 NOTE — Progress Notes (Signed)
   Covid-19 Vaccination Clinic  Name:  ERMINDA KINDERMAN    MRN: [id number] DOB: [birth year]  08/02/2019  Ms. Linden was observed post Covid-19 immunization for 15 minutes without incident. She was provided with Vaccine Information Sheet and instruction to access the V-Safe system.   Ms. Folden was instructed to call 911 with any severe reactions post vaccine: Marland Kitchen Difficulty breathing  . Swelling of face and throat  . A fast heartbeat  . A bad rash all over body  . Dizziness and weakness   Immunizations Administered    Name Date Dose VIS Date Route   Pfizer COVID-19 Vaccine 08/02/2019  1:56 PM 0.3 mL 06/08/2018 Intramuscular   Manufacturer: [place]   Lot: [id number]   [place]: [id number]

## 2019-11-22 ENCOUNTER — Other Ambulatory Visit: Payer: Self-pay | Admitting: Internal Medicine

## 2019-11-22 DIAGNOSIS — Z1231 Encounter for screening mammogram for malignant neoplasm of breast: Secondary | ICD-10-CM

## 2019-12-22 ENCOUNTER — Ambulatory Visit
Admission: RE | Admit: 2019-12-22 | Discharge: 2019-12-22 | Disposition: A | Discharge status: Home / Self Care | Payer: Medicare Other | Source: Ambulatory Visit | Attending: Internal Medicine | Admitting: Internal Medicine

## 2019-12-22 DIAGNOSIS — Z1231 Encounter for screening mammogram for malignant neoplasm of breast: Secondary | ICD-10-CM

## 2020-11-28 ENCOUNTER — Other Ambulatory Visit: Payer: Self-pay | Admitting: Internal Medicine

## 2020-11-28 DIAGNOSIS — Z1231 Encounter for screening mammogram for malignant neoplasm of breast: Secondary | ICD-10-CM

## 2020-12-25 ENCOUNTER — Other Ambulatory Visit: Payer: Self-pay

## 2020-12-25 ENCOUNTER — Ambulatory Visit
Admission: RE | Admit: 2020-12-25 | Discharge: 2020-12-25 | Disposition: A | Discharge status: Home / Self Care | Payer: Medicare Other | Source: Ambulatory Visit | Attending: Internal Medicine | Admitting: Internal Medicine

## 2020-12-25 DIAGNOSIS — Z1231 Encounter for screening mammogram for malignant neoplasm of breast: Secondary | ICD-10-CM | POA: Insufficient documentation

## 2021-01-08 ENCOUNTER — Emergency Department: Payer: Medicare Other

## 2021-01-08 DIAGNOSIS — I10 Essential (primary) hypertension: Secondary | ICD-10-CM | POA: Diagnosis not present

## 2021-01-08 DIAGNOSIS — R911 Solitary pulmonary nodule: Secondary | ICD-10-CM | POA: Insufficient documentation

## 2021-01-08 DIAGNOSIS — Z79899 Other long term (current) drug therapy: Secondary | ICD-10-CM | POA: Diagnosis not present

## 2021-01-08 DIAGNOSIS — J439 Emphysema, unspecified: Secondary | ICD-10-CM | POA: Insufficient documentation

## 2021-01-08 DIAGNOSIS — E039 Hypothyroidism, unspecified: Secondary | ICD-10-CM | POA: Diagnosis not present

## 2021-01-08 DIAGNOSIS — Z87891 Personal history of nicotine dependence: Secondary | ICD-10-CM | POA: Diagnosis not present

## 2021-01-08 DIAGNOSIS — E869 Volume depletion, unspecified: Secondary | ICD-10-CM | POA: Diagnosis not present

## 2021-01-08 DIAGNOSIS — R000 Tachycardia, unspecified: Secondary | ICD-10-CM | POA: Insufficient documentation

## 2021-01-08 DIAGNOSIS — R002 Palpitations: Secondary | ICD-10-CM | POA: Insufficient documentation

## 2021-01-08 LAB — CBC WITH DIFFERENTIAL/PLATELET
Abs Immature Granulocytes: 0.04 10*3/uL (ref 0.00–0.07)
Basophils Absolute: 0 10*3/uL (ref 0.0–0.1)
Basophils Relative: 0 %
Eosinophils Absolute: 0 10*3/uL (ref 0.0–0.5)
Eosinophils Relative: 0 %
HCT: 44.1 % (ref 36.0–46.0)
Hemoglobin: 15.4 g/dL — ABNORMAL HIGH (ref 12.0–15.0)
Immature Granulocytes: 0 %
Lymphocytes Relative: 15 %
Lymphs Abs: 1.5 10*3/uL (ref 0.7–4.0)
MCH: 32.5 pg (ref 26.0–34.0)
MCHC: 34.9 g/dL (ref 30.0–36.0)
MCV: 93 fL (ref 80.0–100.0)
Monocytes Absolute: 0.4 10*3/uL (ref 0.1–1.0)
Monocytes Relative: 4 %
Neutro Abs: 7.9 10*3/uL — ABNORMAL HIGH (ref 1.7–7.7)
Neutrophils Relative %: 81 %
Platelets: 337 10*3/uL (ref 150–400)
RBC: 4.74 MIL/uL (ref 3.87–5.11)
RDW: 11.7 % (ref 11.5–15.5)
WBC: 9.9 10*3/uL (ref 4.0–10.5)
nRBC: 0 % (ref 0.0–0.2)

## 2021-01-08 LAB — COMPREHENSIVE METABOLIC PANEL
ALT: 15 U/L (ref 0–44)
AST: 18 U/L (ref 15–41)
Albumin: 4.3 g/dL (ref 3.5–5.0)
Alkaline Phosphatase: 60 U/L (ref 38–126)
Anion gap: 11 (ref 5–15)
BUN: 19 mg/dL (ref 8–23)
CO2: 25 mmol/L (ref 22–32)
Calcium: 10.3 mg/dL (ref 8.9–10.3)
Chloride: 98 mmol/L (ref 98–111)
Creatinine, Ser: 0.7 mg/dL (ref 0.44–1.00)
GFR, Estimated: >60 mL/min (ref >60)
Glucose, Bld: 126 mg/dL — ABNORMAL HIGH (ref 70–99)
Potassium: 4.4 mmol/L (ref 3.5–5.1)
Sodium: 134 mmol/L — ABNORMAL LOW (ref 135–145)
Total Bilirubin: 1.1 mg/dL (ref 0.3–1.2)
Total Protein: 7.6 g/dL (ref 6.5–8.1)

## 2021-01-08 LAB — TROPONIN I (HIGH SENSITIVITY)
Troponin I (High Sensitivity): 6 ng/L (ref <18)
Troponin I (High Sensitivity): 7 ng/L (ref <18)

## 2021-01-08 NOTE — ED Provider Notes (Signed)
Emergency Medicine Provider Triage Evaluation Note  Kelsey Quinn , a 85 y.o. female  was evaluated in triage.  Pt complains of intermittent fluttering sensation in her chest.  Patient states that it is occurred off and on for the past 2 months.  She does have a history of hypothyroidism.  Denies known cardiac issues in the past.  No current chest tightness or chest pain.  No falls or mechanisms of trauma.  Patient denies blood thinner use.  No other alleviating measures have been attempted.  Review of Systems  Positive: Patient has palpitations Negative: No chest pain, chest tightness or shortness of breath.  Physical Exam  BP (!) 144/91 (BP Location: Left Arm)   Pulse (!) 118   Temp 97.8 F (36.6 C) (Oral)   Resp 16   SpO2 98%  Gen:   Awake, no distress   Resp:  Normal effort, no wheezes, rhonchi or rales. MSK:   Moves extremities without difficulty  Other:    Medical Decision Making  Medically screening exam initiated at 5:28 PM.  Appropriate orders placed.  Doristine Section Percle was informed that the remainder of the evaluation will be completed by another provider, this initial triage assessment does not replace that evaluation, and the importance of remaining in the ED until their evaluation is complete.  Will obtain basic labs including troponin.  Will obtain 2 view chest x-ray as well as EKG and will reassess.   Vallarie Mare Buda, PA-C 01/08/21 Elenore Paddy, MD 01/08/21 [phone]

## 2021-01-08 NOTE — ED Triage Notes (Signed)
Pt presents to ED with c/o of having "a racing heart". Pt reports this has been ongoing for 2 months. Pt denies chest pain or Sob at this time. Pt is A&Ox4.

## 2021-01-09 ENCOUNTER — Emergency Department
Admission: EM | Admit: 2021-01-09 | Discharge: 2021-01-09 | Disposition: A | Discharge status: Home / Self Care | Payer: Medicare Other | Attending: Emergency Medicine | Admitting: Emergency Medicine

## 2021-01-09 ENCOUNTER — Emergency Department: Payer: Medicare Other

## 2021-01-09 ENCOUNTER — Encounter: Payer: Self-pay | Admitting: Radiology

## 2021-01-09 DIAGNOSIS — R002 Palpitations: Secondary | ICD-10-CM

## 2021-01-09 DIAGNOSIS — E869 Volume depletion, unspecified: Secondary | ICD-10-CM

## 2021-01-09 DIAGNOSIS — R000 Tachycardia, unspecified: Secondary | ICD-10-CM

## 2021-01-09 LAB — T4, FREE: Free T4: 1.26 ng/dL — ABNORMAL HIGH (ref 0.61–1.12)

## 2021-01-09 LAB — TSH: TSH: 4.294 u[IU]/mL (ref 0.350–4.500)

## 2021-01-09 MED ORDER — IOHEXOL 350 MG/ML SOLN
75.0000 mL | Freq: Once | INTRAVENOUS | Status: AC | PRN
  Administered 2021-01-09: 75 mL via INTRAVENOUS
  Filled 2021-01-09: qty 75

## 2021-01-09 MED ORDER — LACTATED RINGERS IV BOLUS
1000.0000 mL | Freq: Once | INTRAVENOUS | Status: AC
  Administered 2021-01-09: 1000 mL via INTRAVENOUS

## 2021-01-09 NOTE — ED Provider Notes (Signed)
[facility] Emergency Department Provider Note  ____________________________________________   Event Date/Time   First MD Initiated Contact with Patient 01/09/21 0254     (approximate)  I have reviewed the triage vital signs and the nursing notes.   HISTORY  Chief Complaint Palpitations    HPI Kelsey Quinn is a 85 y.o. female who is generally healthy and active.  She presents for evaluation of intermittent rapid heart rate and palpitations over the last several months.  However it is gotten significantly worse over the last week or so.  Standing up and moving around seems to make it worse, nothing in particular makes it better.  Occasionally she has a sharp chest pain but they are very brief and then go away.  She sometimes feels short of breath when she is having a palpitations.  Today she called her primary care doctor who recommended she go to urgent care.  She went to urgent care and they found that she had a heart rate of 139 so they sent her to the emergency department.  She said that during the 11-hour she has been waiting to be seen (due to overwhelming ED and hospital patient volume) she has felt generally well.  She will occasionally have a chest pain and feels some palpitations but it has been better than it was before.  At times it can be severe and is better with rest, worse with exertion.  She denies fever/chills, nausea, vomiting, abdominal pain, dysuria, and diarrhea.     Past Medical History:  Diagnosis Date   Cancer ([place])    Skin CA resected from Left leg.    Dyspnea    Hyperlipidemia    Hypertension    Hypothyroidism    Osteoporosis     There are no problems to display for this patient.   Past Surgical History:  Procedure Laterality Date   COLONOSCOPY  03/19/2017   COLONOSCOPY WITH PROPOFOL N/A 09/22/2017   Procedure: COLONOSCOPY WITH PROPOFOL;  Surgeon: Lollie Sails, MD;  Location: [facility] ENDOSCOPY;  Service:  Endoscopy;  Laterality: N/A;   CORNEAL CHELATION     detached retina   ESOPHAGOGASTRODUODENOSCOPY (EGD) WITH PROPOFOL N/A 09/22/2017   Procedure: ESOPHAGOGASTRODUODENOSCOPY (EGD) WITH PROPOFOL;  Surgeon: Lollie Sails, MD;  Location: [facility] ENDOSCOPY;  Service: Endoscopy;  Laterality: N/A;   EYE SURGERY     cataract   TRIGGER FINGER RELEASE Right 11/15/2012    Prior to Admission medications   Medication Sig Start Date End Date Taking? Authorizing Provider  Calcium Carb-Cholecalciferol (CALCIUM-VITAMIN D) 500-400 MG-UNIT TABS Take 1 tablet by mouth daily.    [provider]  dicyclomine (BENTYL) 10 MG capsule Take 10 mg by mouth 3 (three) times daily as needed for spasms.    [provider]  GLUCOSAMINE-CHONDROIT-VIT C-MN PO Take 1 tablet by mouth daily.    [provider]  levothyroxine (SYNTHROID, LEVOTHROID) 100 MCG tablet Take 100 mcg by mouth daily before breakfast.    [provider]  losartan (COZAAR) 50 MG tablet Take 50 mg by mouth daily.    [provider]  Melatonin 3 MG TABS Take 1 tablet by mouth as needed.    [provider]  Multiple Vitamins-Minerals (CENTRUM SILVER PO) Take 1 tablet by mouth.    [provider]  Potassium Chloride ER 20 MEQ TBCR Take 10 mEq by mouth daily.    [provider]  Probiotic Product (ALIGN) 4 MG CAPS Take 1 capsule by mouth daily.  [provider]  timolol (BETIMOL) 0.5 % ophthalmic solution Place 1 drop into both eyes 2 (two) times daily.    [provider]    Allergies Sulfa antibiotics  Family History  Problem Relation Age of Onset   Breast cancer Mother 8   Skin cancer Sister 41       1/2 sister maternal side    Social History Social History   Tobacco Use   Smoking status: Former   Smokeless tobacco: Never  Substance Use Topics   Alcohol use: Yes    Review of Systems Constitutional: No fever/chills Eyes: No visual changes. ENT:  No sore throat. Cardiovascular: Positive for episodic palpitations and intermittent minor sharp chest pain. Respiratory: Occasional shortness of breath associated with the palpitations.   Gastrointestinal: No abdominal pain.  No nausea, no vomiting.  No diarrhea.  No constipation. Genitourinary: Negative for dysuria. Musculoskeletal: Negative for neck pain.  Negative for back pain. Integumentary: Negative for rash. Neurological: Negative for headaches, focal weakness or numbness.   ____________________________________________   PHYSICAL EXAM:  VITAL SIGNS: ED Triage Vitals  Enc Vitals Group     BP 01/08/21 1632 (!) 144/91     Pulse Rate 01/08/21 1632 (!) 118     Resp 01/08/21 1632 16     Temp 01/08/21 1632 97.8 F (36.6 C)     Temp Source 01/08/21 1632 Oral     SpO2 01/08/21 1632 98 %     Weight --      Height --      Head Circumference --      Peak Flow --      Pain Score 01/08/21 1721 0     Pain Loc --      Pain Edu? --      Excl. in [place]? --     Constitutional: Alert and oriented.  Eyes: Conjunctivae are normal.  Head: Atraumatic. Nose: No congestion/rhinnorhea. Mouth/Throat: Patient is wearing a mask. Neck: No stridor.  No meningeal signs.   Cardiovascular: Borderline tachycardia with a heart rate of about 100, regular rhythm. Good peripheral circulation. Respiratory: Normal respiratory effort.  No retractions. Gastrointestinal: Soft and nontender. No distention.  Musculoskeletal: No lower extremity tenderness nor edema. No gross deformities of extremities. Neurologic:  Normal speech and language. No gross focal neurologic deficits are appreciated.  Skin:  Skin is warm, dry and intact. Psychiatric: Mood and affect are normal. Speech and behavior are normal.  ____________________________________________   LABS (all labs ordered are listed, but only abnormal results are displayed)  Labs Reviewed  CBC WITH DIFFERENTIAL/PLATELET - Abnormal; Notable for the  following components:      Result Value   Hemoglobin 15.4 (*)    Neutro Abs 7.9 (*)    All other components within normal limits  COMPREHENSIVE METABOLIC PANEL - Abnormal; Notable for the following components:   Sodium 134 (*)    Glucose, Bld 126 (*)    All other components within normal limits  T4, FREE - Abnormal; Notable for the following components:   Free T4 1.26 (*)    All other components within normal limits  TSH  TROPONIN I (HIGH SENSITIVITY)  TROPONIN I (HIGH SENSITIVITY)   ____________________________________________  EKG  ED ECG REPORT I, Hinda Kehr, the attending physician, personally viewed and interpreted this ECG.  Date: 01/08/2021 EKG Time: 17: 10 Rate: 117 Rhythm: sinus tachycardia with occasional premature atrial complexes QRS Axis: Right superior axis deviation Intervals: normal ST/T Wave abnormalities: Non-specific ST segment / T-wave  changes, but no clear evidence of acute ischemia. Narrative Interpretation: no definitive evidence of acute ischemia; does not meet STEMI criteria.  ____________________________________________  RADIOLOGY I, Hinda Kehr, personally viewed and evaluated these images (plain radiographs) as part of my medical decision making, as well as reviewing the written report by the radiologist.  ED MD interpretation: No acute abnormalities on chest x-ray  Official radiology report(s): DG Chest 2 View  Result Date: 01/08/2021 CLINICAL DATA:  Palpitations. EXAM: CHEST - 2 VIEW COMPARISON:  CT chest 08/20/2017 FINDINGS: The heart and mediastinal contours are within normal limits. Aortic calcifications. Hyperinflation. No focal consolidation. No pulmonary edema. No pleural effusion. No pneumothorax. No acute osseous abnormality. IMPRESSION: 1. No active cardiopulmonary disease. 2. Aortic Atherosclerosis (ICD10-I70.0) and Emphysema (ICD10-J43.9). Electronically Signed   By: Iven Finn M.D.   On: 01/08/2021 18:28   CT Angio Chest PE  W and/or Wo Contrast  Result Date: 01/09/2021 CLINICAL DATA:  Rapid heart rate for 2 months EXAM: CT ANGIOGRAPHY CHEST WITH CONTRAST TECHNIQUE: Multidetector CT imaging of the chest was performed using the standard protocol during bolus administration of intravenous contrast. Multiplanar CT image reconstructions and MIPs were obtained to evaluate the vascular anatomy. CONTRAST:  15mL OMNIPAQUE IOHEXOL 350 MG/ML SOLN COMPARISON:  08/20/2017 chest CT FINDINGS: Cardiovascular: Satisfactory opacification of the pulmonary arteries to the segmental level. No evidence of pulmonary embolism. Normal heart size. No pericardial effusion. Atheromatous calcification of the aorta and coronaries. Mediastinum/Nodes: Negative for adenopathy or mass Lungs/Pleura: Emphysema with biapical pleural based scarring. Reticular opacity with clustered nodularity in the anterior segment right upper lobe which is non progressed from 2019 and attributed to scarring. 4 mm left upper lobe pulmonary nodule on 6:39 which is size stable, as is a 3 mm pulmonary nodule in the left lower lobe on 6:59. There is no edema, consolidation, effusion, or pneumothorax. Upper Abdomen: Calcified lymph nodes at the porta hepatis. No emergent finding. Musculoskeletal: Pectus excavatum.  No acute or aggressive finding. Review of the MIP images confirms the above findings. IMPRESSION: 1. Negative for pulmonary embolism or other acute finding. 2. Aortic Atherosclerosis (ICD10-I70.0) and Emphysema (ICD10-J43.9). Electronically Signed   By: Jorje Guild M.D.   On: 01/09/2021 04:46    ____________________________________________   PROCEDURES   Procedure(s) performed (including Critical Care):  .1-3 Lead EKG Interpretation Performed by: Hinda Kehr, MD Authorized by: Hinda Kehr, MD     Interpretation: abnormal     ECG rate:  105   ECG rate assessment: tachycardic     Rhythm: sinus tachycardia     Ectopy: PAC     Conduction: normal      ____________________________________________   INITIAL IMPRESSION / MDM / ASSESSMENT AND PLAN / ED COURSE  As part of my medical decision making, I reviewed the following data within the [place] notes reviewed and incorporated, Labs reviewed , EKG interpreted , Old chart reviewed, and Notes from prior ED visits   Differential diagnosis includes, but is not limited to, nonspecific sinus tachycardia, SVT or AVNRT, metabolic or electrolyte abnormality, hyperthyroidism, acute infection, volume depletion, PE.  The patient is on the cardiac monitor to evaluate for evidence of arrhythmia and/or significant heart rate changes.  EKG is generally reassuring other than sinus tachycardia.  Patient is having intermittent chest pain but this could be unrelated or related to her rate.  Her heart rate was significantly evaded earlier today and has been over 100 during an extended stay in the ED tonight.  She feels asymptomatic currently but her heart rate is right around 100.  She has no obvious risk factors for PE but that is certainly the main concern in terms of emergent or potentially dangerous medical conditions.  Her blood pressure is stable which is reassuring.  Comprehensive metabolic panel is within normal limits and CBC is normal other than a slightly concentrated hemoglobin which could suggest volume depletion, another very common cause for tachycardia as well as the fact that it seems to be somewhat positional and postural.  Her high-sensitivity troponins are negative x2.  Given her history of hypothyroidism, I will check a TSH and free T4 to see if perhaps she is on too much levothyroxine.  I we will give a liter of lactated ringer IV bolus and check a CTA chest to rule out PE.  Patient understands and agrees with the plan and is comfortable following up with PCP or cardiology if there is no indication for admission after the completed work-up.  Clinical Course as of  01/09/21 0513  Wed Jan 09, 2021  0451 T4,Free(Direct)(!): 1.26 Minimally elevated free T4, not likely enough to explain palpitations. [CF]  [phone] CT Angio Chest PE W and/or Wo Contrast Emphysema and atherosclerosis but no evidence of PE or other acute/emergent abnormality.  I will provide reassurance and plan for discharge as previously described. [CF]  [id number] Updated patient, she is comfortable with discharge.  Asymptomatic at this time with a resting HR of 90, which has improved after her liter bolus.  Encouraged hydration and PCP f/u. [CF]    Clinical Course User Index [CF] Hinda Kehr, MD     ____________________________________________  FINAL CLINICAL IMPRESSION(S) / ED DIAGNOSES  Final diagnoses:  Palpitations  Sinus tachycardia  Volume depletion     MEDICATIONS GIVEN DURING THIS VISIT:  Medications  lactated ringers bolus 1,000 mL (1,000 mLs Intravenous New Bag/Given 01/09/21 0357)  iohexol (OMNIPAQUE) 350 MG/ML injection 75 mL (75 mLs Intravenous Contrast Given 01/09/21 0420)     ED Discharge Orders     None        Note:  This document was prepared using Dragon voice recognition software and may include unintentional dictation errors.   Hinda Kehr, MD 01/09/21 [phone]

## 2021-01-09 NOTE — Discharge Instructions (Addendum)
As we discussed, your evaluation was reassuring.  Your heart rate came down after some IV fluids and we suspect that being mildly dehydrated may be causing some of the elevated heart rate, particularly when you change position.  Your lab work was all reassuring as well and you have no evidence of having a heart problem right now.  You had a CTA of your chest which showed no sign of emergent medical condition including no pulmonary embolism or acute infection.  Additionally, your thyroid levels were essentially normal; your free T4 level was just slightly above the upper limit of normal, but unlikely to be high enough to cause the symptoms you are describing.  Please follow-up with your regular doctor at the next available appointment.  Continue taking your medications and try to focus on rehydration.  We included some information about that particular issue.  Return to the emergency department if you develop new or worsening symptoms that concern you.

## 2021-01-28 DIAGNOSIS — I6523 Occlusion and stenosis of bilateral carotid arteries: Secondary | ICD-10-CM | POA: Insufficient documentation

## 2021-01-28 DIAGNOSIS — I2510 Atherosclerotic heart disease of native coronary artery without angina pectoris: Secondary | ICD-10-CM | POA: Insufficient documentation

## 2021-11-22 ENCOUNTER — Other Ambulatory Visit: Payer: Self-pay | Admitting: Internal Medicine

## 2021-11-22 DIAGNOSIS — Z1231 Encounter for screening mammogram for malignant neoplasm of breast: Secondary | ICD-10-CM

## 2021-12-18 ENCOUNTER — Other Ambulatory Visit: Payer: Self-pay | Admitting: Orthopedic Surgery

## 2021-12-18 DIAGNOSIS — M5416 Radiculopathy, lumbar region: Secondary | ICD-10-CM

## 2021-12-18 DIAGNOSIS — M47816 Spondylosis without myelopathy or radiculopathy, lumbar region: Secondary | ICD-10-CM

## 2022-01-10 ENCOUNTER — Ambulatory Visit
Admission: RE | Admit: 2022-01-10 | Discharge: 2022-01-10 | Disposition: A | Discharge status: Home / Self Care | Payer: Medicare Other | Source: Ambulatory Visit | Attending: Orthopedic Surgery | Admitting: Orthopedic Surgery

## 2022-01-10 DIAGNOSIS — M47816 Spondylosis without myelopathy or radiculopathy, lumbar region: Secondary | ICD-10-CM

## 2022-01-10 DIAGNOSIS — M5416 Radiculopathy, lumbar region: Secondary | ICD-10-CM

## 2022-02-17 ENCOUNTER — Ambulatory Visit
Admission: RE | Admit: 2022-02-17 | Discharge: 2022-02-17 | Disposition: A | Discharge status: Home / Self Care | Payer: Medicare Other | Source: Ambulatory Visit | Attending: Internal Medicine | Admitting: Internal Medicine

## 2022-02-17 DIAGNOSIS — Z1231 Encounter for screening mammogram for malignant neoplasm of breast: Secondary | ICD-10-CM | POA: Diagnosis present

## 2022-03-20 ENCOUNTER — Ambulatory Visit (LOCAL_COMMUNITY_HEALTH_CENTER): Payer: Medicare Other

## 2022-03-20 DIAGNOSIS — Z23 Encounter for immunization: Secondary | ICD-10-CM | POA: Diagnosis not present

## 2022-03-20 DIAGNOSIS — Z719 Counseling, unspecified: Secondary | ICD-10-CM

## 2022-03-20 NOTE — Progress Notes (Signed)
  Are you feeling sick today? No   Have you ever received a dose of COVID-19 Vaccine? [employer], [place], [place], [place], Other) Yes  If yes, which vaccine and how many doses?    [place] 5  Did you bring the vaccination record card or other documentation?  No   Do you have a health condition or are undergoing treatment that makes you moderately or severely immunocompromised? This would include, but not be limited to: cancer, HIV, organ transplant, immunosuppressive therapy/high-dose corticosteroids, or moderate/severe primary immunodeficiency.  No  Have you received COVID-19 vaccine before or during hematopoietic cell transplant (HCT) or CAR-T-cell therapies? No  Have you ever had an allergic reaction to: (This would include a severe allergic reaction or a reaction that caused hives, swelling, or respiratory distress, including wheezing.) A component of a COVID-19 vaccine or a previous dose of COVID-19 vaccine? No   Have you ever had an allergic reaction to another vaccine (other thanCOVID-19 vaccine) or an injectable medication? (This would include a severe allergic reaction or a reaction that caused hives, swelling, or respiratory distress, including wheezing.)   No    Do you have a history of any of the following:  Myocarditis or Pericarditis No  Dermal fillers:  No  Multisystem Inflammatory Syndrome (MIS-C or MIS-A)? No  COVID-19 disease within the past 3 months? No  Vaccinated with monkeypox vaccine in the last 4 weeks? No  Administered Comirnaty 12+ 2023-24 formula per pt's request.  Pt tolerated well.  VIS and updated NCIR given.  Stayed 15 minutes for observation; no complaints.  Tonny Branch, RN

## 2023-05-14 ENCOUNTER — Other Ambulatory Visit: Payer: Self-pay

## 2023-05-14 ENCOUNTER — Other Ambulatory Visit
Admission: RE | Admit: 2023-05-14 | Discharge: 2023-05-14 | Disposition: A | Discharge status: Home / Self Care | Payer: Medicare Other | Source: Ambulatory Visit | Attending: Emergency Medicine | Admitting: Emergency Medicine

## 2023-05-14 ENCOUNTER — Emergency Department: Payer: Medicare Other

## 2023-05-14 DIAGNOSIS — E860 Dehydration: Secondary | ICD-10-CM | POA: Diagnosis not present

## 2023-05-14 DIAGNOSIS — R531 Weakness: Secondary | ICD-10-CM | POA: Diagnosis present

## 2023-05-14 DIAGNOSIS — R0602 Shortness of breath: Secondary | ICD-10-CM | POA: Diagnosis present

## 2023-05-14 LAB — TROPONIN I (HIGH SENSITIVITY): Troponin I (High Sensitivity): 7 ng/L (ref <18)

## 2023-05-14 LAB — BRAIN NATRIURETIC PEPTIDE: B Natriuretic Peptide: 90.9 pg/mL (ref 0.0–100.0)

## 2023-05-14 NOTE — ED Provider Triage Note (Signed)
Emergency Medicine Provider Triage Evaluation Note  Kelsey Quinn , a 88 y.o. female  was evaluated in triage.  Pt complains of shortness of breath, weakness "for a while."  She was sent to the ER by [facility] for workup.  Physical Exam  There were no vitals taken for this visit. Gen:   Awake, no distress   Resp:  Normal effort  MSK:   Moves extremities without difficulty  Other:    Medical Decision Making  Medically screening exam initiated at 7:46 PM.  Appropriate orders placed.  Kelsey Quinn was informed that the remainder of the evaluation will be completed by another provider, this initial triage assessment does not replace that evaluation, and the importance of remaining in the ED until their evaluation is complete.  Labs and EKG from earlier today are available in Care Everywhere.    Kelsey Pester, FNP 05/14/23 2108

## 2023-05-14 NOTE — ED Triage Notes (Signed)
Pt states that she has had generalized weakness and SOB for "awhile" with worsening x 1 week. She also has had diarrhea x 1 week. She went to PMD today and they recommended her come to ED for abnormal EKG. Pt denies chest pain.

## 2023-05-15 ENCOUNTER — Emergency Department
Admission: EM | Admit: 2023-05-15 | Discharge: 2023-05-15 | Disposition: A | Discharge status: Home / Self Care | Payer: Medicare Other | Attending: Emergency Medicine | Admitting: Emergency Medicine

## 2023-05-15 DIAGNOSIS — R531 Weakness: Secondary | ICD-10-CM

## 2023-05-15 DIAGNOSIS — E860 Dehydration: Secondary | ICD-10-CM

## 2023-05-15 MED ORDER — LACTATED RINGERS IV BOLUS
1000.0000 mL | Freq: Once | INTRAVENOUS | Status: AC
  Administered 2023-05-15: 1000 mL via INTRAVENOUS

## 2023-05-15 NOTE — Discharge Instructions (Addendum)
As we discussed, we believe her symptoms are caused today by mild volume depletion, or mild dehydration, without any evidence of damage to your body.  Please drink plenty of clear fluids such as water and/or Gatorade and follow up with your regular doctor or the doctors listed in his documentation at the next available opportunity.  Return to the emergency department with any new or worsening symptoms that concern you, including but not limited to fever, shortness of breath, chest pain, or other concerning symptoms. ° ° °

## 2023-05-15 NOTE — ED Provider Notes (Signed)
[facility] Provider Note    Event Date/Time   First MD Initiated Contact with Patient 05/15/23 0201     (approximate)   History   Weakness   HPI Kelsey Quinn is a 88 y.o. female who presents for generalized weakness and some shortness of breath that has been going on for "a while" but worsening somewhat over the last week.  She also had diarrhea before for a week but that has improved.  She was seen at an urgent care today at the recommendation of her primary care doctor who did some lab work and then sent her to the emergency department for additional evaluation given concerns of dehydration and a rapid heart rate on her EKG.  She said that she has had issues with palpitations in the past.  She is having no chest pain.  Shortness of breath is been mild and mostly exertional.  No recent fever, chills, nor body aches.  She no longer is having diarrhea and has no abdominal pain.     Physical Exam   Triage Vital Signs: ED Triage Vitals  Encounter Vitals Group     BP 05/14/23 1947 (!) 157/69     Systolic BP Percentile --      Diastolic BP Percentile --      Pulse Rate 05/14/23 1947 97     Resp 05/14/23 1947 16     Temp 05/14/23 1947 (!) 97.5 F (36.4 C)     Temp Source 05/14/23 1947 Oral     SpO2 05/14/23 1947 100 %     Weight --      Height --      Head Circumference --      Peak Flow --      Pain Score 05/14/23 1948 0     Pain Loc --      Pain Education --      Exclude from Growth Chart --     Most recent vital signs: Vitals:   05/15/23 0430 05/15/23 0500  BP: (!) 152/61 (!) 155/64  Pulse: 90 87  Resp: (!) 21 17  Temp:    SpO2: 100% 100%    General: Awake, no distress.  Conversant, pleasant, tolerating oral intake in the emergency department. CV:  Good peripheral perfusion.  No tachycardia in the emergency department.  Regular rhythm, normal heart sounds. Resp:  Normal effort. Speaking easily and comfortably, no accessory muscle  usage nor intercostal retractions.  Lungs are clear to auscultation. Abd:  No distention.  No tenderness to palpation.   ED Results / Procedures / Treatments   Labs (all labs ordered are listed, but only abnormal results are displayed) Labs Reviewed - No data to display   EKG  ED ECG REPORT I, Loleta Rose, the attending physician, personally viewed and interpreted this ECG.  Date: 05/14/2023 EKG Time: 19: 54  Rate: 107 Rhythm: Mild sinus tachycardia QRS Axis: Right superior axis deviation Intervals: Right ventricular hypertrophy ST/T Wave abnormalities: Non-specific ST segment / T-wave changes, but no clear evidence of acute ischemia. Narrative Interpretation: no definitive evidence of acute ischemia; does not meet STEMI criteria.    RADIOLOGY I viewed and interpreted the patient's 1 view chest x-ray.  No evidence of pneumonia.  I also read the radiologist's report, which confirmed no acute findings.   PROCEDURES:  Critical Care performed: No  Procedures    IMPRESSION / MDM / ASSESSMENT AND PLAN / ED COURSE  I reviewed the triage vital signs and  the nursing notes.                              Differential diagnosis includes, but is not limited to, dehydration, electrolyte or metabolic abnormality, viral illness, pneumonia, ACS.  Patient's presentation is most consistent with acute presentation with potential threat to life or bodily function.  Labs/studies ordered: 1 view chest x-ray, EKG  Interventions/Medications given:  Medications  lactated ringers bolus 1,000 mL (0 mLs Intravenous Stopped 05/15/23 0514)    (Note:  hospital course my include additional interventions and/or labs/studies not listed above.)   I reviewed the patient's lab work that was done as an outpatient.  It was generally reassuring but her overall presentation is suggestive of mild dehydration and she appears a bit volume depleted.  I think this likely explains her mild tachycardia as  well and she is likely volume depleted because of the recent diarrhea.  Her heart rate has been steady in the 80s and 90s in the emergency department despite 11 hours of waiting due to overwhelming ED and hospital patient volume.  She is very pleasant, awake and alert and in no distress.  I talked with her about IV fluids to supplement the need for oral rehydration and she agrees with the plan.  Plan is for discharge and outpatient follow-up with primary care doctor at the next available opportunity and I strongly encouraged increased oral intake.  She says she understands this is important.  I considered hospitalization mostly given her age, but given her stable vital signs and reassuring lab work as an outpatient and reassuring assessment in the emergency department, there is no indication she would benefit from staying in the hospital and she should be appropriate and safe for discharge.  I gave my usual return precautions.       FINAL CLINICAL IMPRESSION(S) / ED DIAGNOSES   Final diagnoses:  Dehydration  Generalized weakness     Rx / DC Orders   ED Discharge Orders     None        Note:  This document was prepared using Dragon voice recognition software and may include unintentional dictation errors.   Loleta Rose, MD 05/15/23 [phone]

## 2023-06-01 ENCOUNTER — Other Ambulatory Visit: Payer: Self-pay

## 2023-06-01 ENCOUNTER — Inpatient Hospital Stay
Admission: RE | Admit: 2023-06-01 | Discharge: 2023-06-01 | Disposition: A | Discharge status: Home / Self Care | Payer: Self-pay | Source: Ambulatory Visit | Attending: Orthopedic Surgery | Admitting: Orthopedic Surgery

## 2023-06-01 DIAGNOSIS — Z049 Encounter for examination and observation for unspecified reason: Secondary | ICD-10-CM

## 2023-06-02 NOTE — Progress Notes (Deleted)
 Referring Physician:  Denton Lank, FNP [id number] [place],  [place] [address]  Primary Physician:  Gracelyn Nurse, MD  History of Present Illness: 06/02/2023*** [name] has a history of hypothyroidism, hypercholesterolemia, HTN, Raynaud's, CAD, eye inflammatory disease, detached retina, and osteoporosis.   Lumbar Pain  Duration: *** Location: *** Quality: *** Severity: ***  Precipitating: aggravated by *** Modifying factors: made better by *** Weakness: none Timing: *** Bowel/Bladder Dysfunction: none  Conservative measures:  Physical therapy: *** Patient participated in PT at [facility] from 11/29/21 through 12/17/2021 Multimodal medical therapy including regular antiinflammatories: ***  Injections:  04/21/2023: Left L5-S1 transforaminal ESI (50% Celestone 9 mg) 03/05/2023: Left L5-S1 transforaminal ESI (75% relief for 3 days then gradual return of pain) 02/14/2022: Left L5-S1 transforaminal ESI (50% relief)   Past Surgery: No spinal surgeries.   Yamilee E Crisp has ***no symptoms of cervical myelopathy.  The symptoms are causing a significant impact on the patient's life.   Review of Systems:  A 10 point review of systems is negative, except for the pertinent positives and negatives detailed in the HPI.  Past Medical History: Past Medical History:  Diagnosis Date   Cancer (HCC)    Skin CA resected from Left leg.    Dyspnea    Hyperlipidemia    Hypertension    Hypothyroidism    Osteoporosis     Past Surgical History: Past Surgical History:  Procedure Laterality Date   COLONOSCOPY  03/19/2017   COLONOSCOPY WITH PROPOFOL N/A 09/22/2017   Procedure: COLONOSCOPY WITH PROPOFOL;  Surgeon: Christena Deem, MD;  Location: [facility] ENDOSCOPY;  Service: Endoscopy;  Laterality: N/A;   CORNEAL CHELATION     detached retina   ESOPHAGOGASTRODUODENOSCOPY (EGD) WITH PROPOFOL N/A 09/22/2017   Procedure: ESOPHAGOGASTRODUODENOSCOPY (EGD) WITH  PROPOFOL;  Surgeon: Christena Deem, MD;  Location: [facility] ENDOSCOPY;  Service: Endoscopy;  Laterality: N/A;   EYE SURGERY     cataract   TRIGGER FINGER RELEASE Right 11/15/2012    Allergies: Allergies as of 06/04/2023 - Review Complete 05/14/2023  Allergen Reaction Noted   Sulfa antibiotics Rash 08/20/2017    Medications: Outpatient Encounter Medications as of 06/04/2023  Medication Sig   Calcium Carb-Cholecalciferol (CALCIUM-VITAMIN D) 500-400 MG-UNIT TABS Take 1 tablet by mouth daily.   dicyclomine (BENTYL) 10 MG capsule Take 10 mg by mouth 3 (three) times daily as needed for spasms.   GLUCOSAMINE-CHONDROIT-VIT C-MN PO Take 1 tablet by mouth daily.   levothyroxine (SYNTHROID, LEVOTHROID) 100 MCG tablet Take 100 mcg by mouth daily before breakfast.   losartan (COZAAR) 50 MG tablet Take 50 mg by mouth daily.   Melatonin 3 MG TABS Take 1 tablet by mouth as needed.   Multiple Vitamins-Minerals (CENTRUM SILVER PO) Take 1 tablet by mouth.   Potassium Chloride ER 20 MEQ TBCR Take 10 mEq by mouth daily.   Probiotic Product (ALIGN) 4 MG CAPS Take 1 capsule by mouth daily.   timolol (BETIMOL) 0.5 % ophthalmic solution Place 1 drop into both eyes 2 (two) times daily.   No facility-administered encounter medications on file as of 06/04/2023.    Social History: Social History   Tobacco Use   Smoking status: Former   Smokeless tobacco: Never  Substance Use Topics   Alcohol use: Yes    Family Medical History: Family History  Problem Relation Age of Onset   Breast cancer Mother 19   Skin cancer Sister 53       1/2 sister maternal  side    Physical Examination: There were no vitals filed for this visit.  General: Patient is well developed, well nourished, calm, collected, and in no apparent distress. Attention to examination is appropriate.  Respiratory: Patient is breathing without any difficulty.   NEUROLOGICAL:     Awake, alert, oriented to person, place, and time.  Speech  is clear and fluent. Fund of knowledge is appropriate.   Cranial Nerves: Pupils equal round and reactive to light.  Facial tone is symmetric.    *** ROM of cervical spine *** pain *** posterior cervical tenderness. *** tenderness in bilateral trapezial region.   *** ROM of lumbar spine *** pain *** posterior lumbar tenderness.   No abnormal lesions on exposed skin.   Strength: Side Biceps Triceps Deltoid Interossei Grip Wrist Ext. Wrist Flex.  R 5 5 5 5 5 5 5   L 5 5 5 5 5 5 5    Side Iliopsoas Quads Hamstring PF DF EHL  R 5 5 5 5 5 5   L 5 5 5 5 5 5    Reflexes are ***2+ and symmetric at the biceps, brachioradialis, patella and achilles.   Hoffman's is absent.  Clonus is not present.   Bilateral upper and lower extremity sensation is intact to light touch.     Gait is normal.   ***No difficulty with tandem gait.    Medical Decision Making  Imaging: Lumbar MRI dated 01/10/22:  FINDINGS: Segmentation:  Standard.   Alignment:  Mild levoscoliosis.   Vertebrae:  No fracture, evidence of discitis, or bone lesion.   Conus medullaris and cauda equina: Conus extends to the L1 level. Conus and cauda equina appear normal.   Paraspinal and other soft tissues: Negative for perispinal mass or inflammation. Root sleeve cysts at multiple levels with bony scalloping at the left L2-3 foramen and at the left S2 foramen. Left S2 nerve root is displaced by the meningeal cyst which measures up to 2.3 cm.   Disc levels:   T12- L1: Unremarkable.   L1-L2: Small central protrusion.   L2-L3: Mild disc bulging. Foraminal root sleeve cyst with bony scalloping on the left.   L3-L4: Disc bulging. Facet spurring and ligamentum flavum thickening. Moderate spinal stenosis. Moderate left foraminal narrowing   L4-L5: Disc narrowing and bulging. Facet spurring and ligamentous thickening. Advanced spinal stenosis with complete effacement of CSF. Moderate right and mild-to-moderate left foraminal  narrowing   L5-S1:Mild disc bulging and facet spurring.   IMPRESSION: 1. Generalized lumbar spine degeneration with mild scoliosis. 2. Spinal stenosis that is advanced at L4-5 and moderate at L3-4. Moderate foraminal narrowings at the same levels. 3. Multiple root sleeve cysts with bony scalloping at the left L2-3 foramen and left S2 anterior foramen.     Electronically Signed   By: Tiburcio Pea M.D.   On: 01/14/2022 07:27    I have personally reviewed the images and agree with the above interpretation.  Assessment and Plan: Ms. Moor is a pleasant 88 y.o. female has ***  Treatment options discussed with patient and following plan made:   - Order for physical therapy for *** spine ***. Patient to call to schedule appointment. *** - Continue current medications including ***. Reviewed dosing and side effects.  - Prescription for ***. Reviewed dosing and side effects. Take with food.  - Prescription for *** to take prn muscle spasms. Reviewed dosing and side effects. Discussed this can cause drowsiness.  - MRI of *** to further evaluate *** radiculopathy. No improvement  time or medications (***).  - Referral to PMR at [facility] to discuss possible *** injections.  - Will schedule phone visit to review MRI results once I get them back.   I spent a total of *** minutes in face-to-face and non-face-to-face activities related to this patient's care today including review of outside records, review of imaging, review of symptoms, physical exam, discussion of differential diagnosis, discussion of treatment options, and documentation.   Thank you for involving me in the care of this patient.   Drake Leach PA-C Dept. of Neurosurgery

## 2023-06-04 ENCOUNTER — Ambulatory Visit: Payer: Medicare Other | Admitting: Orthopedic Surgery

## 2023-06-06 NOTE — Progress Notes (Signed)
 Referring Physician:  Denton Lank, FNP [address] [place],  [place] [address]  Primary Physician:  Gracelyn Nurse, MD  History of Present Illness: 06/18/2023 [name] has a history of hypothyroidism, hypercholesterolemia, HTN, Raynaud's, CAD, eye inflammatory disease, detached retina, and osteoporosis.   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   She has 1-2 year history of intermittent LBP with intermittent left > right leg pain posterior to her feet. Pain is better with sitting. Pain is really only with standing and walking. She has intermittent numbness and weakness in her legs. Grocery cart helps.   She sees PMR at [facility] and did not have improvement.   She does not smoke.   Bowel/Bladder Dysfunction: none. She's been having excessive diarrhea and is seeing GI next week. No perineal numbness.   Conservative measures:  Physical therapy:  Patient participated in PT at [facility] from 11/29/21 through 12/17/2021 with no relief.  Multimodal medical therapy including regular antiinflammatories: none  Injections:  04/21/2023: Left L5-S1 transforaminal ESI (50% Celestone 9 mg) 03/05/2023: Left L5-S1 transforaminal ESI (75% relief for 3 days then gradual return of pain) 02/14/2022: Left L5-S1 transforaminal ESI (50% relief)   Past Surgery: No spinal surgeries.   Camella E Vanduyn has no symptoms of cervical myelopathy.  The symptoms are causing a significant impact on the patient's life.   Review of Systems:  A 10 point review of systems is negative, except for the pertinent positives and negatives detailed in the HPI.  Past Medical History: Past Medical History:  Diagnosis Date   Cancer (HCC)    Skin CA resected from Left leg.    Dyspnea    Hyperlipidemia    Hypertension    Hypothyroidism    Osteoporosis     Past Surgical History: Past Surgical History:  Procedure Laterality Date   COLONOSCOPY   03/19/2017   COLONOSCOPY WITH PROPOFOL N/A 09/22/2017   Procedure: COLONOSCOPY WITH PROPOFOL;  Surgeon: Christena Deem, MD;  Location: [facility] ENDOSCOPY;  Service: Endoscopy;  Laterality: N/A;   CORNEAL CHELATION     detached retina   ESOPHAGOGASTRODUODENOSCOPY (EGD) WITH PROPOFOL N/A 09/22/2017   Procedure: ESOPHAGOGASTRODUODENOSCOPY (EGD) WITH PROPOFOL;  Surgeon: Christena Deem, MD;  Location: [facility] ENDOSCOPY;  Service: Endoscopy;  Laterality: N/A;   EYE SURGERY     cataract   TRIGGER FINGER RELEASE Right 11/15/2012    Allergies: Allergies as of 06/18/2023 - Review Complete 05/14/2023  Allergen Reaction Noted   Sulfa antibiotics Rash 08/20/2017    Medications: Outpatient Encounter Medications as of 06/18/2023  Medication Sig   Calcium Carb-Cholecalciferol (CALCIUM-VITAMIN D) 500-400 MG-UNIT TABS Take 1 tablet by mouth daily.   dicyclomine (BENTYL) 10 MG capsule Take 10 mg by mouth 3 (three) times daily as needed for spasms.   GLUCOSAMINE-CHONDROIT-VIT C-MN PO Take 1 tablet by mouth daily.   levothyroxine (SYNTHROID, LEVOTHROID) 100 MCG tablet Take 100 mcg by mouth daily before breakfast.   losartan (COZAAR) 50 MG tablet Take 50 mg by mouth daily.   Melatonin 3 MG TABS Take 1 tablet by mouth as needed.   Multiple Vitamins-Minerals (CENTRUM SILVER PO) Take 1 tablet by mouth.   Potassium Chloride ER 20 MEQ TBCR Take 10 mEq by mouth daily.   Probiotic Product (ALIGN) 4 MG CAPS Take 1 capsule by mouth daily.   timolol (BETIMOL) 0.5 % ophthalmic solution Place 1 drop into both eyes 2 (two) times daily.   No  facility-administered encounter medications on file as of 06/18/2023.    Social History: Social History   Tobacco Use   Smoking status: Former   Smokeless tobacco: Never  Substance Use Topics   Alcohol use: Yes    Family Medical History: Family History  Problem Relation Age of Onset   Breast cancer Mother 66   Skin cancer Sister 5       1/2 sister maternal side     Physical Examination: There were no vitals filed for this visit.  General: Patient is well developed, well nourished, calm, collected, and in no apparent distress. Attention to examination is appropriate.  Respiratory: Patient is breathing without any difficulty.   NEUROLOGICAL:     Awake, alert, oriented to person, place, and time.  Speech is clear and fluent. Fund of knowledge is appropriate.   Cranial Nerves: Pupils equal round and reactive to light.  Facial tone is symmetric.    No posterior lumbar tenderness.   No abnormal lesions on exposed skin.   Strength: Side Biceps Triceps Deltoid Interossei Grip Wrist Ext. Wrist Flex.  R 5 5 5 5 5 5 5   L 5 5 5 5 5 5 5    Side Iliopsoas Quads Hamstring PF DF EHL  R 5 5 5 5 5 5   L 5 5 5 5 5 5    Reflexes are 1+ and symmetric at the biceps, brachioradialis, patella and achilles.   Hoffman's is absent.  Clonus is not present.   Bilateral upper and lower extremity sensation is intact to light touch.     No pain with IR/ER of both hips.   She ambulates with walker. Has slow gait.   Medical Decision Making  Imaging: Lumbar MRI dated 01/10/22:  FINDINGS: Segmentation:  Standard.   Alignment:  Mild levoscoliosis.   Vertebrae:  No fracture, evidence of discitis, or bone lesion.   Conus medullaris and cauda equina: Conus extends to the L1 level. Conus and cauda equina appear normal.   Paraspinal and other soft tissues: Negative for perispinal mass or inflammation. Root sleeve cysts at multiple levels with bony scalloping at the left L2-3 foramen and at the left S2 foramen. Left S2 nerve root is displaced by the meningeal cyst which measures up to 2.3 cm.   Disc levels:   T12- L1: Unremarkable.   L1-L2: Small central protrusion.   L2-L3: Mild disc bulging. Foraminal root sleeve cyst with bony scalloping on the left.   L3-L4: Disc bulging. Facet spurring and ligamentum flavum thickening. Moderate spinal stenosis.  Moderate left foraminal narrowing   L4-L5: Disc narrowing and bulging. Facet spurring and ligamentous thickening. Advanced spinal stenosis with complete effacement of CSF. Moderate right and mild-to-moderate left foraminal narrowing   L5-S1:Mild disc bulging and facet spurring.   IMPRESSION: 1. Generalized lumbar spine degeneration with mild scoliosis. 2. Spinal stenosis that is advanced at L4-5 and moderate at L3-4. Moderate foraminal narrowings at the same levels. 3. Multiple root sleeve cysts with bony scalloping at the left L2-3 foramen and left S2 anterior foramen.     Electronically Signed   By: Tiburcio Pea M.D.   On: 01/14/2022 07:27    Lumbar xrays dated 11/07/21:  AP, lateral, and oblique views of the lumbar spine were reviewed. Images reveal mild scoliosis. Degenerative changes of the facet joints noted. Calcification of the abdominal aorta noted. Mild osteophyte formation. No fractures or listhesis noted. Exam End: 11/07/21 14:26 Last Resulted: 11/07/21 17:00  Received From: [facility]  Result  Received: 05/14/23 18:03     I have personally reviewed the images and agree with the above interpretation.  Assessment and Plan: Ms. Amescua has a 1-2 year history of intermittent LBP with intermittent left > right leg pain posterior to her feet that is only with standing and walking. She has intermittent numbness and weakness in her legs. Grocery cart helps.   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 Left S2 nerve root is displaced by the meningeal cyst which measures up to 2.3 cm.  Also with moderate central stenosis L3-L4 with moderate left foraminal stenosis and moderate to severe central stenosis L4-L5 with moderate bilateral foraminal stenosis.    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   Treatment options discussed with patient and following plan made:   - MRI of lumbar spine  ordered to further evaluate spinal stenosis. No improvement with time, injections, or previous PT in 2023.  - PT for lumbar spine. She wants to go to [facility] in [place]. Message sent to [facility] to see if she will put in the orders.  - Will schedule follow visit to review MRI results once I get them back.  - She likely will need to see Dr. Myer Haff (Tues/Thurs are better for her for appointments) if no improvement with PT.   BP was initially low. No symptoms. Recheck BP improved. She still had no symptoms. She checks BP at home and will call PCP with any issues.   I spent a total of 45 minutes in face-to-face and non-face-to-face activities related to this patient's care today including review of outside records, review of imaging, review of symptoms, physical exam, discussion of differential diagnosis, discussion of treatment options, and documentation.   Thank you for involving me in the care of this patient.   Drake Leach PA-C Dept. of Neurosurgery

## 2023-06-18 ENCOUNTER — Encounter: Payer: Self-pay | Admitting: Orthopedic Surgery

## 2023-06-18 ENCOUNTER — Ambulatory Visit: Payer: Medicare Other | Admitting: Orthopedic Surgery

## 2023-06-18 VITALS — BP 110/62 | Ht 63.0 in | Wt 81.0 lb

## 2023-06-18 DIAGNOSIS — M48061 Spinal stenosis, lumbar region without neurogenic claudication: Secondary | ICD-10-CM

## 2023-06-18 DIAGNOSIS — M47816 Spondylosis without myelopathy or radiculopathy, lumbar region: Secondary | ICD-10-CM

## 2023-06-18 DIAGNOSIS — M4726 Other spondylosis with radiculopathy, lumbar region: Secondary | ICD-10-CM

## 2023-06-18 DIAGNOSIS — G96191 Perineural cyst: Secondary | ICD-10-CM | POA: Diagnosis not present

## 2023-06-18 DIAGNOSIS — M5416 Radiculopathy, lumbar region: Secondary | ICD-10-CM

## 2023-06-18 NOTE — Patient Instructions (Signed)
 It was so nice to see you today. Thank you so much for coming in.    Your MRI from 2023 shows wear and tear in your back along with spinal stenosis (pressure on spinal cord).   I want to get an updated MRI of your lower back to look into things further. We will get this approved through your insurance and [facility] will call you to schedule the appointment.   After you have the MRI, it takes 14-21 days for me to get the results back. Once I have them, we will call you to schedule a follow up visit with me to review them.   I also want you to start physical therapy for your lower back. I sent Whitney a message to see if she will put in orders for the [facility]. You can call them at [phone] if you don't hear from them to schedule your visit.   Please do not hesitate to call if you have any questions or concerns. You can also message me in MyChart.   Drake Leach PA-C [phone]     The physicians and staff at [facility] at [facility] are committed to providing excellent care. You may receive a survey asking for feedback about your experience at our office. We value you your feedback and appreciate you taking the time to to fill it out. The [facility] leadership team is also available to discuss your experience in person, feel free to contact us [phone].

## 2023-06-23 ENCOUNTER — Ambulatory Visit: Attending: Orthopedic Surgery | Admitting: Occupational Therapy

## 2023-06-23 DIAGNOSIS — M6281 Muscle weakness (generalized): Secondary | ICD-10-CM | POA: Insufficient documentation

## 2023-06-23 DIAGNOSIS — M25642 Stiffness of left hand, not elsewhere classified: Secondary | ICD-10-CM | POA: Insufficient documentation

## 2023-06-23 DIAGNOSIS — M79645 Pain in left finger(s): Secondary | ICD-10-CM | POA: Insufficient documentation
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
## 2023-07-09 ENCOUNTER — Ambulatory Visit
Admission: RE | Admit: 2023-07-09 | Discharge: 2023-07-09 | Disposition: A | Discharge status: Home / Self Care | Source: Ambulatory Visit | Attending: Orthopedic Surgery | Admitting: Orthopedic Surgery

## 2023-07-09 DIAGNOSIS — M5416 Radiculopathy, lumbar region: Secondary | ICD-10-CM

## 2023-07-09 DIAGNOSIS — M48061 Spinal stenosis, lumbar region without neurogenic claudication: Secondary | ICD-10-CM

## 2023-07-09 DIAGNOSIS — M47816 Spondylosis without myelopathy or radiculopathy, lumbar region: Secondary | ICD-10-CM

## 2023-07-16 ENCOUNTER — Ambulatory Visit: Attending: Orthopedic Surgery | Admitting: Occupational Therapy

## 2023-07-16 DIAGNOSIS — M6281 Muscle weakness (generalized): Secondary | ICD-10-CM | POA: Insufficient documentation

## 2023-07-16 DIAGNOSIS — M79645 Pain in left finger(s): Secondary | ICD-10-CM | POA: Diagnosis present

## 2023-07-16 DIAGNOSIS — M25642 Stiffness of left hand, not elsewhere classified: Secondary | ICD-10-CM | POA: Diagnosis present

## 2023-07-16 NOTE — Therapy (Signed)
 OUTPATIENT OCCUPATIONAL THERAPY ORTHO TREATMENT  Patient Name: Kelsey Quinn MRN: [id number] DOB:20-Jan-1936, 88 y.o., female Today's Date: 07/16/2023  PCP: Marcelino Duster, MD REFERRING PROVIDER: Kennedy Bucker, MD  END OF SESSION:  OT End of Session - 07/16/23 1540     Visit Number 2    Number of Visits 13    Date for OT Re-Evaluation 08/21/23    OT Start Time 1320    OT Stop Time 1403    OT Time Calculation (min) 43 min    Activity Tolerance Patient tolerated treatment well    Behavior During Therapy [facility] for tasks assessed/performed             Past Medical History:  Diagnosis Date   Cancer (HCC)    Skin CA resected from Left leg.    Dyspnea    Hyperlipidemia    Hypertension    Hypothyroidism    Osteoporosis    Past Surgical History:  Procedure Laterality Date   COLONOSCOPY  03/19/2017   COLONOSCOPY WITH PROPOFOL N/A 09/22/2017   Procedure: COLONOSCOPY WITH PROPOFOL;  Surgeon: Christena Deem, MD;  Location: [facility] ENDOSCOPY;  Service: Endoscopy;  Laterality: N/A;   CORNEAL CHELATION     detached retina   ESOPHAGOGASTRODUODENOSCOPY (EGD) WITH PROPOFOL N/A 09/22/2017   Procedure: ESOPHAGOGASTRODUODENOSCOPY (EGD) WITH PROPOFOL;  Surgeon: Christena Deem, MD;  Location: [facility] ENDOSCOPY;  Service: Endoscopy;  Laterality: N/A;   EYE SURGERY     cataract   TRIGGER FINGER RELEASE Right 11/15/2012   There are no active problems to display for this patient.   ONSET DATE: 12/14/2023  REFERRING DIAG: Gamekeeper's thumb of left hand  THERAPY DIAG:  Pain of left thumb  Muscle weakness (generalized)  Stiffness of left hand, not elsewhere classified  Rationale for Evaluation and Treatment: Rehabilitation  SUBJECTIVE:   SUBJECTIVE STATEMENT: I have been compensating for so long.  But I do not have strength in my thumb to hold object to open, or pinch, opening small packages or picking up pills.  And it hurts if I put pressure force to my thumb. Pt accompanied  by: self  PERTINENT HISTORY: Per last MD note:  The patient is an 88 year old female who presents with left thumb triggering. This is her initial evaluation. She did have an injury in the fall where she sprained her thumb. There is no evidence of acute trauma. She reports increased discomfort due to her inability to grasp or pinch objects. She recalls an incident from the previous night where she was unable to open a bottle of eye drops. Her primary concern is the impact on her grip strength. Imaging AP, lateral, and oblique x-rays of the left hand were ordered and personally reviewed today. These show extensive arthritis at the thumb CMC and thumb MCP joint with radial deviation of the thumb, consistent with a gamekeeper injury. She did have an injury in the fall where she sprained her thumb. There is no evidence of acute trauma. X-ray impression is osteoarthritis throughout the hand with ulnar deviation of the thumb MCP joint, consistent with gamekeeper's thumb. She has a torn ulnar collateral ligament in her left thumb, confirmed by x-ray. The ligament has stretched and has not healed, causing difficulty in gripping and pinching. She reports difficulty gripping objects and performing tasks such as opening medicine bottles. Surgical intervention was discussed, but given her age, it may not be the most suitable option. Instead, a referral to a hand therapist, Marisue Humble du [place], OTR/L, CLT, has been  made to discuss adaptive devices to facilitate daily activities. The hand therapist will work on either bracing or using equipment to manage the condition without surgery.   PRECAUTIONS: None  WEIGHT BEARING RESTRICTIONS: No  PAIN:  Are you having pain? Yes: NPRS scale: 4 Pain location: left thumb Pain description: aching in left thumb, occasional sharp shooting pains at times.   Aggravating factors: increased gripping and use Relieving factors: none  FALLS: Has patient fallen in last 6 months?  No  LIVING ENVIRONMENT: Lives with: lives alone Lives in: House/apartment Stairs: No Has following equipment at home:  pt uses electric can opener, bottle opener  PLOF: Independent, she does not drive, uses local transportation services.  Modified independent with basic self care tasks.    PATIENT GOALS: Pt would like to decrease left hand pain , increase functional use and be more independent with tasks.    NEXT MD VISIT: unsure of date, she does have PT at [facility] clinic for her back, has an MRI on 07/09/2023  OBJECTIVE:  Note: Objective measures were completed at Evaluation unless otherwise noted.  HAND DOMINANCE: Right  ADLs: Overall ADLs: Patient reports she has difficulty with opening eyedrop bottles, squeezing the toothpaste tube, managing small buttons, using the computer and difficulty picking up a cup with the left hand.  She uses microwave for meals.  Has difficulty with holding dentures at times.  She uses a jar and bottle opener at home and orders her groceries for delivery.  She does not drive and is dependent on public transportation for appts.    FUNCTIONAL OUTCOME MEASURES: TBD  UPPER EXTREMITY ROM:     Active ROM Right eval Left eval  Shoulder flexion [facility]  Shoulder abduction    Shoulder adduction    Shoulder extension    Shoulder internal rotation    Shoulder external rotation    Elbow flexion [facility] W[facility]  Elbow extension    Wrist flexion    Wrist extension    Wrist ulnar deviation    Wrist radial deviation    Wrist pronation    Wrist supination    (Blank rows = not tested)  Active ROM Right eval Left eval  Thumb MCP (0-60) 60 60  Thumb IP (0-80) 80 76  Thumb Radial abd/add (0-55) 55  50  Thumb Palmar abd/add (0-45) 45 45   Thumb Opposition to Small Finger     Index MCP (0-90)     Index PIP (0-100)     Index DIP (0-70)      Long MCP (0-90)      Long PIP (0-100)      Long DIP (0-70)      Ring MCP (0-90)      Ring PIP (0-100)      Ring DIP  (0-70)      Little MCP (0-90)      Little PIP (0-100)      Little DIP (0-70)      (Blank rows = not tested) R hand with full motion for composite fisting, full opposition and full extension of digits. Left hand with full fisting, full opposition to small finger and the base of small finger.    HAND FUNCTION: Grip strength: Right: 30 lbs; Left: 32 with pain lbs, Lateral pinch: Right: 10 lbs, Left: 2 with pain lbs, and 3 point pinch: Right: 10 lbs, Left: 3 with pain lbs with ADD of proximal thumb and deviation of middle and IP -   COORDINATION: Impaired fine motor  and gross motor coordination secondary to arthritis and decreased hand manipulation skills - and thumb ulnar deviation and ADD of proximal thumb   SENSATION: Pt has a history of Raynaud's disease, denies any numbness  EDEMA: mild edema present in left hand and thumb area.    COGNITION: Overall cognitive status: Within functional limits for tasks assessed   TREATMENT DATE: 07/16/2023                                                                                                                            ADL:  Pt  to cont with  activity modifications and use of adaptive equipment to assist with ADL/IADL tasks.  Pt educated on button hook, larger grips, gripping patterns, moving of items in kitchen and other select equipment to modify tasks.    Assess patient's grip and prehension strength again.  Patient unable to do at 3 point pinch while maintaining a oval.  Thumb abductors proximally with a deviation at the middle and distal phalanges And lateral pinch same adduction proximal thumb with deviation of middle and DIP. Unable to take or do pressure. Decreasing her ability do precision pinch-unable to isolate IP flexion for persistent pinch As well as pain with prehension strength more than grip.  Fist fitted patient with a CMC neoprene to facilitate proximal thumb into some abduction.  Working on [employer]. But did not provide  patient with increased functional use of thumb, as well as not decreased pain.  Fabricated for patient hand-based MC block splint for thumb allowing more IP flexion, providing patient more with a precision pinch. With [facility] block splint for thumb patient able to open small packages and pick up her pills, paperclips. Patient also able to hold a cylinder object to open it with the right hand. Patient had less pain in the thumb with grip and prehension reassessment.  Recommend for patient to sleep with CMC neoprene and use and cross motor activities like driving ligator or think of the cat or sweeping And use smaller MC block splint for fine motor and ADLs and IADLs.   PATIENT EDUCATION: Education details: gripping patterns, adaptive equipment, activity modifications.  And splint use Person educated: Patient Education method: Explanation and Demonstration Education comprehension: verbalized understanding and needs further education    GOALS: Goals reviewed with patient? Yes  SHORT TERM GOALS: Target date: 07/16/2023  Pt will demonstrate understanding of modifications to gripping patterns and use of built up handles for ADL and IADL tasks. Baseline: no knowledge at eval  Goal status: INITIAL   LONG TERM GOALS: Target date: 08/21/2023  Pt will demonstrate managing eye drops with modified independence. Baseline: difficulty with squeezing eye drop bottles, which is used multiple times a day  Goal status: INITIAL  2.  Pt will demonstrate modifications to managing toothpaste tube for oral hygiene/care. Baseline: difficulty with squeezing toothpaste tube. Goal status: INITIAL  3.  Pt will demonstrate ability to manage buttons, snaps and zippers with modified  techniques/use of adaptive equipment for greater independence.  Baseline: difficulty with managing buttons at eval  Goal status: INITIAL  4.  Pt will demonstrate improved left hand functional to pick up and manage a cup in her left  hand for drinking. Baseline: difficulty holding cup in left hand at eval. Goal status: INITIAL  ASSESSMENT:  CLINICAL IMPRESSION: Patient is a 88 y.o. female who was seen for occupational therapy evaluation for left gamekeeper's thumb. Pt lives alone, she has been modified independent with basic self care tasks and select IADLs.  She does not drive and is dependent on public transportation to get to medical appts.  She has attempted to modify some tasks such as using an electric can opener, bottle opener and using microwave more for meals than managing/lifting pots and pans.  She presents with pain in left thumb and hand, decreased grip/pinch and hand function on left, decreased ability to perform self care and IADL tasks.  This date assessed patient with a couple of splints for decreasing pain and increasing function.  CMC neoprene splint provide patient with a little more webspace abduction of thumb and patient to sleep with it and use with gross motor activities.  Fabricated patient a MC block hand-based splint to allow more IP imprecision pinch.  Patient had decreased pain as well as was able to pick up pills, open small packages pick a paperclip as well as grip around cylinder object to open with the right hand.  Recommend for patient to use splints for about 2 weeks and follow-up with me.  She would benefit from skilled OT services to maximize safety and independence in necessary daily tasks.    PERFORMANCE DEFICITS: in functional skills including ADLs, IADLs, coordination, dexterity, sensation, edema, ROM, strength, pain, flexibility, Fine motor control, Gross motor control, decreased knowledge of use of DME, and UE functional use and psychosocial skills including environmental adaptation, habits, and routines and behaviors.   IMPAIRMENTS: are limiting patient from ADLs, IADLs, rest and sleep, and social participation.   COMORBIDITIES: has co-morbidities such as Raynaud's disease, arthritis, eye  impairments with calcium accumulation  that affects occupational performance. Patient will benefit from skilled OT to address above impairments and improve overall function.  MODIFICATION OR ASSISTANCE TO COMPLETE EVALUATION: Min-Moderate modification of tasks or assist with assess necessary to complete an evaluation.  OT OCCUPATIONAL PROFILE AND HISTORY: Comprehensive assessment: Review of records and extensive additional review of physical, cognitive, psychosocial history related to current functional performance.  CLINICAL DECISION MAKING: Moderate - several treatment options, min-mod task modification necessary  REHAB POTENTIAL: Fair pt not a surgical candidate at this time per MD, needs adaptive devices   EVALUATION COMPLEXITY: Moderate      PLAN:  OT FREQUENCY: 1-2x/week  OT DURATION: 6 weeks  PLANNED INTERVENTIONS: 97168 OT Re-evaluation, 97535 self care/ADL training, 19147 therapeutic exercise, 97530 therapeutic activity, 97112 neuromuscular re-education, 97140 manual therapy, 97035 ultrasound, 97018 paraffin, 82956 fluidotherapy, 97010 moist heat, 97010 cryotherapy, 97034 contrast bath, 97760 Orthotics management and training, 21308 Splinting (initial encounter), passive range of motion, coping strategies training, patient/family education, and DME and/or AE instructions  RECOMMENDED OTHER SERVICES: none currently.  CONSULTED AND AGREED WITH PLAN OF CARE: Patient  PLAN FOR NEXT SESSION: provide additional education on adaptive devices, modifications for daily tasks, assess for splinting needs.   Tyisha Cressy [place] OTR/L, CLT 07/16/2023, 3:41 PM

## 2023-08-04 ENCOUNTER — Ambulatory Visit: Admitting: Occupational Therapy

## 2023-08-04 DIAGNOSIS — M25642 Stiffness of left hand, not elsewhere classified: Secondary | ICD-10-CM

## 2023-08-04 DIAGNOSIS — M6281 Muscle weakness (generalized): Secondary | ICD-10-CM

## 2023-08-04 DIAGNOSIS — M79645 Pain in left finger(s): Secondary | ICD-10-CM

## 2023-08-04 NOTE — Therapy (Signed)
 OUTPATIENT OCCUPATIONAL THERAPY ORTHO TREATMENT  Patient Name: Kelsey Quinn MRN: [id number] DOB:04/11/1936, 88 y.o., female Today's Date: 08/04/2023  PCP: Martine Sleek, MD REFERRING PROVIDER: Molli Angelucci, MD  END OF SESSION:  OT End of Session - 08/04/23 1405     Visit Number 3    Number of Visits 13    Date for OT Re-Evaluation 08/21/23    OT Start Time 1405    Activity Tolerance Patient tolerated treatment well    Behavior During Therapy [facility] for tasks assessed/performed             Past Medical History:  Diagnosis Date   Cancer (HCC)    Skin CA resected from Left leg.    Dyspnea    Hyperlipidemia    Hypertension    Hypothyroidism    Osteoporosis    Past Surgical History:  Procedure Laterality Date   COLONOSCOPY  03/19/2017   COLONOSCOPY WITH PROPOFOL  N/A 09/22/2017   Procedure: COLONOSCOPY WITH PROPOFOL ;  Surgeon: Deveron Fly, MD;  Location: [facility] ENDOSCOPY;  Service: Endoscopy;  Laterality: N/A;   CORNEAL CHELATION     detached retina   ESOPHAGOGASTRODUODENOSCOPY (EGD) WITH PROPOFOL  N/A 09/22/2017   Procedure: ESOPHAGOGASTRODUODENOSCOPY (EGD) WITH PROPOFOL ;  Surgeon: Deveron Fly, MD;  Location[facility] ENDOSCOPY;  Service: Endoscopy;  Laterality: N/A;   EYE SURGERY     cataract   TRIGGER FINGER RELEASE Right 11/15/2012   There are no active problems to display for this patient.   ONSET DATE: 12/14/2023  REFERRING DIAG: Gamekeeper's thumb of left hand  THERAPY DIAG:  Pain of left thumb  Muscle weakness (generalized)  Stiffness of left hand, not elsewhere classified  Rationale for Evaluation and Treatment: Rehabilitation  SUBJECTIVE:   SUBJECTIVE STATEMENT: I have been using the black soft splint more than that tiny splint.  I feel able to do most everything.  I am going to the steakhouse on Thursday so I want you to filet mignon. Pt accompanied by: self  PERTINENT HISTORY: Per last MD note:  The patient is an 88 year old female  who presents with left thumb triggering. This is her initial evaluation. She did have an injury in the fall where she sprained her thumb. There is no evidence of acute trauma. She reports increased discomfort due to her inability to grasp or pinch objects. She recalls an incident from the previous night where she was unable to open a bottle of eye drops. Her primary concern is the impact on her grip strength. Imaging AP, lateral, and oblique x-rays of the left hand were ordered and personally reviewed today. These show extensive arthritis at the thumb CMC and thumb MCP joint with radial deviation of the thumb, consistent with a gamekeeper injury. She did have an injury in the fall where she sprained her thumb. There is no evidence of acute trauma. X-ray impression is osteoarthritis throughout the hand with ulnar deviation of the thumb MCP joint, consistent with gamekeeper's thumb. She has a torn ulnar collateral ligament in her left thumb, confirmed by x-ray. The ligament has stretched and has not healed, causing difficulty in gripping and pinching. She reports difficulty gripping objects and performing tasks such as opening medicine bottles. Surgical intervention was discussed, but given her age, it may not be the most suitable option. Instead, a referral to a hand therapist, Evone Hoh du [place], OTR/L, CLT, has been made to discuss adaptive devices to facilitate daily activities. The hand therapist will work on either bracing or using equipment to manage  the condition without surgery.   PRECAUTIONS: None  WEIGHT BEARING RESTRICTIONS: No  PAIN:  Are you having pain? Yes: NPRS scale: 4 Pain location: left thumb Pain description: aching in left thumb, occasional sharp shooting pains at times.   Aggravating factors: increased gripping and use Relieving factors: none  FALLS: Has patient fallen in last 6 months? No  LIVING ENVIRONMENT: Lives with: lives alone Lives in: House/apartment Stairs: No Has  following equipment at home:  pt uses electric can opener, bottle opener  PLOF: Independent, she does not drive, uses local transportation services.  Modified independent with basic self care tasks.    PATIENT GOALS: Pt would like to decrease left hand pain , increase functional use and be more independent with tasks.    NEXT MD VISIT: unsure of date, she does have PT at [facility] for her back, has an MRI on 07/09/2023  OBJECTIVE:  Note: Objective measures were completed at Evaluation unless otherwise noted.  HAND DOMINANCE: Right  ADLs: Overall ADLs: Patient reports she has difficulty with opening eyedrop bottles, squeezing the toothpaste tube, managing small buttons, using the computer and difficulty picking up a cup with the left hand.  She uses microwave for meals.  Has difficulty with holding dentures at times.  She uses a jar and bottle opener at home and orders her groceries for delivery.  She does not drive and is dependent on public transportation for appts.    FUNCTIONAL OUTCOME MEASURES: TBD  UPPER EXTREMITY ROM:     Active ROM Right eval Left eval  Shoulder flexion [facility]  Shoulder abduction    Shoulder adduction    Shoulder extension    Shoulder internal rotation    Shoulder external rotation    Elbow flexion [facility] WFL  Elbow extension    Wrist flexion    Wrist extension    Wrist ulnar deviation    Wrist radial deviation    Wrist pronation    Wrist supination    (Blank rows = not tested)  Active ROM Right eval Left eval L 08/04/23  Thumb MCP (0-60) 60 60   Thumb IP (0-80) 80 76   Thumb Radial abd/add (0-55) 55  50 55  Thumb Palmar abd/add (0-45) 45 45  60  Thumb Opposition to Small Finger      Index MCP (0-90)      Index PIP (0-100)      Index DIP (0-70)       Long MCP (0-90)       Long PIP (0-100)       Long DIP (0-70)       Ring MCP (0-90)       Ring PIP (0-100)       Ring DIP (0-70)       Little MCP (0-90)       Little PIP (0-100)        Little DIP (0-70)       (Blank rows = not tested) R hand with full motion for composite fisting, full opposition and full extension of digits. Left hand with full fisting, full opposition to small finger and the base of small finger.    HAND FUNCTION: Grip strength: Right: 30 lbs; Left: 32 with pain lbs, Lateral pinch: Right: 10 lbs, Left: 2 with pain lbs, and 3 point pinch: Right: 10 lbs, Left: 3 with pain lbs with ADD of proximal thumb and deviation of middle and IP -  08/04/23 : Grip strength: Right: 35 lbs; Left:  32 with pain lbs, Lateral pinch: Right: 10 lbs, Left: 4 with pain lbs, and 3 point pinch: Right: 10 lbs, Left: 4 with pain lbs Less pain than last visit  COORDINATION: At eval-  Impaired fine motor and gross motor coordination secondary to arthritis and decreased hand manipulation skills - and thumb ulnar deviation and ADD of proximal thumb   SENSATION: Pt has a history of Raynaud's disease, denies any numbness  EDEMA: mild edema present in left hand and thumb area.    COGNITION: Overall cognitive status: Within functional limits for tasks assessed   TREATMENT DATE: 08/04/2023                                                                                                                            ADL:  Pt  to cont with  activity modifications and use of adaptive equipment to assist with ADL/IADL tasks.  Pt educated on button hook, larger grips, gripping patterns, moving of items in kitchen and other select equipment to modify tasks.     Patient arrived after using CMC neoprene splint the last 3 weeks as well as MC block splint for thumb Patient with increased palmar abduction. Patient able to do 2 point and 3 point pinch picking up small objects as well as paperclip. Able to open plastic bags initiating precision grip with no splint on. Patient is able to maintain oval with some palmar abduction with 2 and 3-point pinch. All of which she could not do 3 weeks  ago. Patient show increased to 3 point pinch without splints with still pain at the MCP and IP of thumb. But pain appears to be less.  Patient this date 10 isolated IP flexion during a 2 and 3-point pinch that she could not do last time Encourage patient to continue to wear CMC neoprene to facilitate proximal thumb into some abduction.  For her to maintain webspace. But did not provide patient with increased functional use of thumb, as well as not decreased pain.  Patient can continue to use hand-based MC block splint for thumb allowing more IP flexion, providing patient more with a precision pinch as needed. This date patient was able to open small packages and pick up her pills, paperclips. Patient could also hold a cylinder object to open it with the right hand. Doing above without any splints. Patient was able to cut simulated meat holding a knife and fork with CMC neoprene. Recommend for patient to sleep with CMC neoprene and use and gross motor activities l    PATIENT EDUCATION: Education details: gripping patterns, adaptive equipment, activity modifications.  And splint use Person educated: Patient Education method: Explanation and Demonstration Education comprehension: verbalized understanding and needs further education    GOALS: Goals reviewed with patient? Yes  SHORT TERM GOALS: Target date: 07/16/2023  Pt will demonstrate understanding of modifications to gripping patterns and use of built up handles for ADL and IADL tasks. Baseline: no knowledge at eval  Goal status: INITIAL   LONG TERM GOALS: Target date: 08/21/2023  Pt will demonstrate managing eye drops with modified independence. Baseline: difficulty with squeezing eye drop bottles, which is used multiple times a day  Goal status: INITIAL  2.  Pt will demonstrate modifications to managing toothpaste tube for oral hygiene/care. Baseline: difficulty with squeezing toothpaste tube. Goal status: INITIAL  3.   Pt will demonstrate ability to manage buttons, snaps and zippers with modified techniques/use of adaptive equipment for greater independence.  Baseline: difficulty with managing buttons at eval  Goal status: INITIAL  4.  Pt will demonstrate improved left hand functional to pick up and manage a cup in her left hand for drinking. Baseline: difficulty holding cup in left hand at eval. Goal status: INITIAL  ASSESSMENT:  CLINICAL IMPRESSION: Patient is a 88 y.o. female who was seen for occupational therapy evaluation for left gamekeeper's thumb. Pt lives alone, she has been modified independent with basic self care tasks and select IADLs.  She does not drive and is dependent on public transportation to get to medical appts.  She has attempted to modify some tasks such as using an electric can opener, bottle opener and using microwave more for meals than managing/lifting pots and pans.  She presents with pain in left thumb and hand, decreased grip/pinch and hand function on left, decreased ability to perform self care and IADL tasks.  3 weeks ago patient was fitted 2 splints for decreasing pain and increasing function.  CMC neoprene splint provide patient with a little more webspace abduction of thumb and patient to sleep with it  to maintain webspace and use with gross motor activities.  Fabricated patient a MC block hand-based splint to allow more IP imprecision pinch.  This date patient presented OT with increased 2 and 3 point pinch.  Able to pick up small objects, opening packages, opening jars and bottles with no splint.  Patient able to maintain partial palmar abduction during 2 and 3 point pinch.  Patient showed increased 3-point pinch and lateral pinch with no splints.  Reinforced with patient to continue to wear CMC neoprene at nighttime and during the day some to maintain her webspace and palmar radial abduction.  She can use a smaller splint as needed.  Will continue to with home program for 3 weeks  and we will follow-up again.   She would benefit from skilled OT services to maximize safety and independence in necessary daily tasks.    PERFORMANCE DEFICITS: in functional skills including ADLs, IADLs, coordination, dexterity, sensation, edema, ROM, strength, pain, flexibility, Fine motor control, Gross motor control, decreased knowledge of use of DME, and UE functional use and psychosocial skills including environmental adaptation, habits, and routines and behaviors.   IMPAIRMENTS: are limiting patient from ADLs, IADLs, rest and sleep, and social participation.   COMORBIDITIES: has co-morbidities such as Raynaud's disease, arthritis, eye impairments with calcium accumulation  that affects occupational performance. Patient will benefit from skilled OT to address above impairments and improve overall function.  MODIFICATION OR ASSISTANCE TO COMPLETE EVALUATION: Min-Moderate modification of tasks or assist with assess necessary to complete an evaluation.  OT OCCUPATIONAL PROFILE AND HISTORY: Comprehensive assessment: Review of records and extensive additional review of physical, cognitive, psychosocial history related to current functional performance.  CLINICAL DECISION MAKING: Moderate - several treatment options, min-mod task modification necessary  REHAB POTENTIAL: Fair pt not a surgical candidate at this time per MD, needs adaptive devices   EVALUATION COMPLEXITY: Moderate  PLAN:  OT FREQUENCY: 1-2x/week  OT DURATION: 6 weeks  PLANNED INTERVENTIONS: 97168 OT Re-evaluation, 97535 self care/ADL training, 16109 therapeutic exercise, 97530 therapeutic activity, 97112 neuromuscular re-education, 97140 manual therapy, 97035 ultrasound, 97018 paraffin, 60454 fluidotherapy, 97010 moist heat, 97010 cryotherapy, 97034 contrast bath, 97760 Orthotics management and training, 09811 Splinting (initial encounter), passive range of motion, coping strategies training, patient/family education,  and DME and/or AE instructions  RECOMMENDED OTHER SERVICES: none currently.  CONSULTED AND AGREED WITH PLAN OF CARE: Patient  PLAN FOR NEXT SESSION: provide additional education on adaptive devices, modifications for daily tasks, assess for splinting needs.   Lyrique Hakim du [place]z OTR/L, CLT 08/04/2023, 2:06 PM

## 2023-08-21 NOTE — Progress Notes (Unsigned)
 Referring Physician:  Little Riff, MD [address] [place],  [place] [address]  Primary Physician:  Little Riff, MD  History of Present Illness: 08/21/2023 [name] has a history of hypothyroidism, hypercholesterolemia, HTN, Raynaud's, CAD, eye inflammatory disease, detached retina, and osteoporosis.   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   Last seen by me on 06/18/23 for LBP and bilateral leg pain.   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 Left S2 nerve root is displaced by the meningeal cyst which measures up to 2.3 cm. Also with moderate central stenosis L3-L4 with moderate left foraminal stenosis and moderate to severe central stenosis L4-L5 with moderate bilateral foraminal stenosis.    She was sent to PT and MRI of lumbar spine was ordered.   She is here for follow up.   She had initial PT evaluation at [facility] on 08/06/23 and had 2 more visits through 08/21/23.***     She has 1-2 year history of intermittent LBP with intermittent left > right leg pain posterior to her feet. Pain is better with sitting. Pain is really only with standing and walking. She has intermittent numbness and weakness in her legs. Grocery cart helps.   She sees PMR at [facility] and did not have improvement.   She does not smoke.   Bowel/Bladder Dysfunction: none. She's been having excessive diarrhea and is seeing GI next week. No perineal numbness.   Conservative measures:  Physical therapy: initial PT evaluation at [facility] on 08/06/23 and had 2 more visits through 08/21/23 Multimodal medical therapy including regular antiinflammatories: none  Injections:  04/21/2023: Left L5-S1 transforaminal ESI (50% Celestone 9 mg) 03/05/2023: Left L5-S1 transforaminal ESI (75% relief for 3 days then gradual return of pain) 02/14/2022: Left L5-S1 transforaminal ESI (50% relief)   Past Surgery: No spinal surgeries.   Kelsey E  Quinn has no symptoms of cervical myelopathy.  The symptoms are causing a significant impact on the patient's life.   Review of Systems:  A 10 point review of systems is negative, except for the pertinent positives and negatives detailed in the HPI.  Past Medical History: Past Medical History:  Diagnosis Date   Cancer (HCC)    Skin CA resected from Left leg.    Dyspnea    Hyperlipidemia    Hypertension    Hypothyroidism    Osteoporosis     Past Surgical History: Past Surgical History:  Procedure Laterality Date   COLONOSCOPY  03/19/2017   COLONOSCOPY WITH PROPOFOL  N/A 09/22/2017   Procedure: COLONOSCOPY WITH PROPOFOL ;  Surgeon: Deveron Fly, MD;  Location: [facility] ENDOSCOPY;  Service: Endoscopy;  Laterality: N/A;   CORNEAL CHELATION     detached retina   ESOPHAGOGASTRODUODENOSCOPY (EGD) WITH PROPOFOL  N/A 09/22/2017   Procedure: ESOPHAGOGASTRODUODENOSCOPY (EGD) WITH PROPOFOL ;  Surgeon: Deveron Fly, MD;  Location: [facility] ENDOSCOPY;  Service: Endoscopy;  Laterality: N/A;   EYE SURGERY     cataract   TRIGGER FINGER RELEASE Right 11/15/2012    Allergies: Allergies as of 08/25/2023 - Review Complete 06/18/2023  Allergen Reaction Noted   Sulfa antibiotics Rash 08/20/2017    Medications: Outpatient Encounter Medications as of 08/25/2023  Medication Sig   Acetaminophen 500 MG capsule Take by mouth.   amLODipine (NORVASC) 2.5 MG tablet Take 2.5 mg by mouth daily.   Calcium Carb-Cholecalciferol (CALCIUM-VITAMIN D) 500-400 MG-UNIT TABS Take 1 tablet by mouth daily.   dicyclomine (BENTYL)  10 MG capsule Take 10 mg by mouth 3 (three) times daily as needed for spasms.   GLUCOSAMINE-CHONDROIT-VIT C-MN PO Take 1 tablet by mouth daily.   levothyroxine (SYNTHROID, LEVOTHROID) 100 MCG tablet Take 75 mcg by mouth daily before breakfast.   losartan (COZAAR) 50 MG tablet Take 50 mg by mouth daily.   Melatonin 3 MG TABS Take 1 tablet by mouth as needed.   Multiple Vitamins-Minerals  (CENTRUM SILVER PO) Take 1 tablet by mouth.   Potassium Chloride ER 20 MEQ TBCR Take 10 mEq by mouth daily.   Probiotic Product (ALIGN) 4 MG CAPS Take 1 capsule by mouth daily.   timolol (BETIMOL) 0.5 % ophthalmic solution Place 1 drop into both eyes 2 (two) times daily.   No facility-administered encounter medications on file as of 08/25/2023.    Social History: Social History   Tobacco Use   Smoking status: Former    Current packs/day: 0.00    Types: Cigarettes    Quit date: 1996    Years since quitting: 29.3   Smokeless tobacco: Never  Substance Use Topics   Alcohol use: Yes    Family Medical History: Family History  Problem Relation Age of Onset   Breast cancer Mother 28   Skin cancer Sister 52       1/2 sister maternal side    Physical Examination: There were no vitals filed for this visit.    Awake, alert, oriented to person, place, and time.  Speech is clear and fluent. Fund of knowledge is appropriate.   Cranial Nerves: Pupils equal round and reactive to light.  Facial tone is symmetric.    No posterior lumbar tenderness.   No abnormal lesions on exposed skin.   Strength: Side Biceps Triceps Deltoid Interossei Grip Wrist Ext. Wrist Flex.  R 5 5 5 5 5 5 5   L 5 5 5 5 5 5 5    Side Iliopsoas Quads Hamstring PF DF EHL  R 5 5 5 5 5 5   L 5 5 5 5 5 5    Reflexes are 1+ and symmetric at the biceps, brachioradialis, patella and achilles.   Hoffman's is absent.  Clonus is not present.   Bilateral upper and lower extremity sensation is intact to light touch.     No pain with IR/ER of both hips.   She ambulates with walker. Has slow gait.   Medical Decision Making  Imaging: Lumbar MRI dated 07/09/23:  FINDINGS: Segmentation: 5 lumbar type vertebral bodies as numbered previously.   Alignment: Thoracolumbar curvature convex to the right and lower lumbar curvature convex to the left.   Vertebrae:  No fracture or focal bone lesion.   Conus medullaris and  cauda equina: Conus extends to the L1 level. Conus and cauda equina appear normal.   Paraspinal and other soft tissues: Negative   Disc levels:   T11-12: Normal   T12-L1: Central disc bulge.  No compressive stenosis.   L1-2: Central disc bulge.  No compressive stenosis.   L2-3: Mild bulging of the disc. Mild facet and ligamentous hypertrophy. Mild narrowing of the lateral recesses but no compressive stenosis.   L3-4: Moderate bulging of the disc. Facet and ligamentous hypertrophy. Moderate multifactorial stenosis with crowding of the nerve roots. Neural compression could occur at this level. Slight worsening since the prior exam.   L4-5: Broad-based protrusion of the disc. Pronounced facet and ligamentous hypertrophy. Severe multifactorial stenosis that could be symptomatic on either or both sides. Similar appearance to the study  of 2023.   L5-S1: Mild bulging of the disc. Mild facet osteoarthritis. No compressive stenosis.   Multiple root sleeve cysts are seen within the lower thoracic and lumbar region and there are also Tarlov cysts of the sacrum. Usually, these are felt to be asymptomatic.   IMPRESSION: 1. Scoliotic curvature. 2. L3-4: Moderate bulging of the disc. Facet and ligamentous hypertrophy. Moderate multifactorial stenosis with crowding of the nerve roots. Neural compression could occur at this level. Slight worsening since the prior exam. 3. L4-5: Broad-based protrusion of the disc. Pronounced facet and ligamentous hypertrophy. Severe multifactorial stenosis that could be symptomatic on either or both sides. Similar appearance to the study of 2023. 4. L5-S1: Disc bulge and facet osteoarthritis. No compressive stenosis.     Electronically Signed   By: Bettylou Brunner M.D.   On: 08/11/2023 07:34    I have personally reviewed the images and agree with the above interpretation.  Assessment and Plan: Ms. Hetman has a 1-2 year history of intermittent LBP  with intermittent left > right leg pain posterior to her feet that is only with standing and walking. She has intermittent numbness and weakness in her legs. Grocery cart helps.   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 Left S2 nerve root is displaced by the meningeal cyst which measures up to 2.3 cm.  Also with moderate central stenosis L3-L4 with moderate left foraminal stenosis and moderate to severe central stenosis L4-L5 with moderate bilateral foraminal stenosis.    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   Treatment options discussed with patient and following plan made:   - MRI of lumbar spine ordered to further evaluate spinal stenosis. No improvement with time, injections, or previous PT in 2023.  - PT for lumbar spine. She wants to go to [facility] in [place]. Message sent to [facility] to see if she will put in the orders.  - Will schedule follow visit to review MRI results once I get them back.  - She likely will need to see Dr. Mont Antis (Tues/Thurs are better for her for appointments) if no improvement with PT.   BP was initially low. No symptoms. Recheck BP improved. She still had no symptoms. She checks BP at home and will call PCP with any issues.   I spent a total of 45 minutes in face-to-face and non-face-to-face activities related to this patient's care today including review of outside records, review of imaging, review of symptoms, physical exam, discussion of differential diagnosis, discussion of treatment options, and documentation.   Thank you for involving me in the care of this patient.   Lucetta Russel PA-C Dept. of Neurosurgery

## 2023-08-25 ENCOUNTER — Encounter: Payer: Self-pay | Admitting: Orthopedic Surgery

## 2023-08-25 ENCOUNTER — Ambulatory Visit: Admitting: Orthopedic Surgery

## 2023-08-25 VITALS — BP 122/74 | Ht 63.0 in | Wt 81.0 lb

## 2023-08-25 DIAGNOSIS — M48061 Spinal stenosis, lumbar region without neurogenic claudication: Secondary | ICD-10-CM | POA: Diagnosis not present

## 2023-08-25 DIAGNOSIS — M47816 Spondylosis without myelopathy or radiculopathy, lumbar region: Secondary | ICD-10-CM

## 2023-08-25 DIAGNOSIS — M4726 Other spondylosis with radiculopathy, lumbar region: Secondary | ICD-10-CM | POA: Diagnosis not present

## 2023-08-25 DIAGNOSIS — M5416 Radiculopathy, lumbar region: Secondary | ICD-10-CM

## 2023-08-25 DIAGNOSIS — G96191 Perineural cyst: Secondary | ICD-10-CM | POA: Diagnosis not present

## 2023-08-25 NOTE — Patient Instructions (Signed)
 It was so nice to see you today. Thank you so much for coming in.    You have wear and tear in your back along with spinal stenosis (pressure on spinal cord). MRI look about the same from you last one.   I want you to continue with physical therapy.   I will see you back in 6 weeks. Please do not hesitate to call if you have any questions or concerns. You can also message me in MyChart.   If you decide you want to see Dr. Mont Antis to discuss possible surgery options then let me know and we will change your appointment to see him.   Kelsey Russel PA-C [phone]     The physicians and staff at [facility] Neurosurgery at [facility] are committed to providing excellent care. You may receive a survey asking for feedback about your experience at our office. We value you your feedback and appreciate you taking the time to to fill it out. The [facility] leadership team is also available to discuss your experience in person, feel free to contact us  [phone].

## 2023-09-01 ENCOUNTER — Ambulatory Visit: Attending: Orthopedic Surgery | Admitting: Occupational Therapy

## 2023-09-01 DIAGNOSIS — M6281 Muscle weakness (generalized): Secondary | ICD-10-CM | POA: Insufficient documentation

## 2023-09-01 DIAGNOSIS — M79645 Pain in left finger(s): Secondary | ICD-10-CM | POA: Diagnosis present

## 2023-09-01 DIAGNOSIS — M25642 Stiffness of left hand, not elsewhere classified: Secondary | ICD-10-CM | POA: Diagnosis present

## 2023-09-01 NOTE — Therapy (Signed)
 OUTPATIENT OCCUPATIONAL THERAPY ORTHO TREATMENT/RECERT/ DISCHARGE  Patient Name: WILHELMINA HARK MRN: [id number] DOB:22-Oct-1935, 88 y.o., female Today's Date: 09/01/2023  PCP: Martine Sleek, MD REFERRING PROVIDER: Molli Angelucci, MD  END OF SESSION:  OT End of Session - 09/01/23 1408     Visit Number 4    Number of Visits 4    Date for OT Re-Evaluation 09/01/23    OT Start Time 1409    OT Stop Time 1432    OT Time Calculation (min) 23 min    Activity Tolerance Patient tolerated treatment well    Behavior During Therapy [facility] for tasks assessed/performed             Past Medical History:  Diagnosis Date   Cancer (HCC)    Skin CA resected from Left leg.    Dyspnea    Hyperlipidemia    Hypertension    Hypothyroidism    Osteoporosis    Past Surgical History:  Procedure Laterality Date   COLONOSCOPY  03/19/2017   COLONOSCOPY WITH PROPOFOL  N/A 09/22/2017   Procedure: COLONOSCOPY WITH PROPOFOL ;  Surgeon: Deveron Fly, MD;  Location: [facility] ENDOSCOPY;  Service: Endoscopy;  Laterality: N/A;   CORNEAL CHELATION     detached retina   ESOPHAGOGASTRODUODENOSCOPY (EGD) WITH PROPOFOL  N/A 09/22/2017   Procedure: ESOPHAGOGASTRODUODENOSCOPY (EGD) WITH PROPOFOL ;  Surgeon: Deveron Fly, MD;  Location: [facility] ENDOSCOPY;  Service: Endoscopy;  Laterality: N/A;   EYE SURGERY     cataract   TRIGGER FINGER RELEASE Right 11/15/2012   There are no active problems to display for this patient.   ONSET DATE: 12/14/2023  REFERRING DIAG: Gamekeeper's thumb of left hand  THERAPY DIAG:  Pain of left thumb  Muscle weakness (generalized)  Stiffness of left hand, not elsewhere classified  Rationale for Evaluation and Treatment: Rehabilitation  SUBJECTIVE:   SUBJECTIVE STATEMENT: I been doing good.  Using mostly the soft black splint.  I can do most everything.  No trouble with bathing or dressing.  Or in the kitchen or laundry.  I did buy me some Dycem to help with my grip when  opening big jars. Pt accompanied by: self  PERTINENT HISTORY: Per last MD note:  The patient is an 88 year old female who presents with left thumb triggering. This is her initial evaluation. She did have an injury in the fall where she sprained her thumb. There is no evidence of acute trauma. She reports increased discomfort due to her inability to grasp or pinch objects. She recalls an incident from the previous night where she was unable to open a bottle of eye drops. Her primary concern is the impact on her grip strength. Imaging AP, lateral, and oblique x-rays of the left hand were ordered and personally reviewed today. These show extensive arthritis at the thumb CMC and thumb MCP joint with radial deviation of the thumb, consistent with a gamekeeper injury. She did have an injury in the fall where she sprained her thumb. There is no evidence of acute trauma. X-ray impression is osteoarthritis throughout the hand with ulnar deviation of the thumb MCP joint, consistent with gamekeeper's thumb. She has a torn ulnar collateral ligament in her left thumb, confirmed by x-ray. The ligament has stretched and has not healed, causing difficulty in gripping and pinching. She reports difficulty gripping objects and performing tasks such as opening medicine bottles. Surgical intervention was discussed, but given her age, it may not be the most suitable option. Instead, a referral to a hand therapist, Evone Hoh du  Preez, OTR/L, CLT, has been made to discuss adaptive devices to facilitate daily activities. The hand therapist will work on either bracing or using equipment to manage the condition without surgery.   PRECAUTIONS: None  WEIGHT BEARING RESTRICTIONS: No  PAIN:  Are you having pain?  2/10 pain in the left thumb with 3-point pinch FALLS: Has patient fallen in last 6 months? No  LIVING ENVIRONMENT: Lives with: lives alone Lives in: House/apartment Stairs: No Has following equipment at home: pt uses  electric can opener, bottle opener  PLOF: Independent, she does not drive, uses local transportation services.  Modified independent with basic self care tasks.    PATIENT GOALS: Pt would like to decrease left hand pain , increase functional use and be more independent with tasks.    NEXT MD VISIT: unsure of date, she does have PT at [facility] for her back, has an MRI on 07/09/2023  OBJECTIVE:  Note: Objective measures were completed at Evaluation unless otherwise noted.  HAND DOMINANCE: Right  ADLs: Overall ADLs: Patient reports she has difficulty with opening eyedrop bottles, squeezing the toothpaste tube, managing small buttons, using the computer and difficulty picking up a cup with the left hand.  She uses microwave for meals.  Has difficulty with holding dentures at times.  She uses a jar and bottle opener at home and orders her groceries for delivery.  She does not drive and is dependent on public transportation for appts.    FUNCTIONAL OUTCOME MEASURES: TBD  UPPER EXTREMITY ROM:     Active ROM Right eval Left eval  Shoulder flexion [facility]  Shoulder abduction    Shoulder adduction    Shoulder extension    Shoulder internal rotation    Shoulder external rotation    Elbow flexion [facility] W[facility]  Elbow extension    Wrist flexion    Wrist extension    Wrist ulnar deviation    Wrist radial deviation    Wrist pronation    Wrist supination    (Blank rows = not tested)  Active ROM Right eval Left eval L 08/04/23  Thumb MCP (0-60) 60 60   Thumb IP (0-80) 80 76   Thumb Radial abd/add (0-55) 55  50 55  Thumb Palmar abd/add (0-45) 45 45  60  Thumb Opposition to Small Finger      Index MCP (0-90)      Index PIP (0-100)      Index DIP (0-70)       Long MCP (0-90)       Long PIP (0-100)       Long DIP (0-70)       Ring MCP (0-90)       Ring PIP (0-100)       Ring DIP (0-70)       Little MCP (0-90)       Little PIP (0-100)       Little DIP (0-70)       (Blank rows =  not tested) R hand with full motion for composite fisting, full opposition and full extension of digits. Left hand with full fisting, full opposition to small finger and the base of small finger.    HAND FUNCTION: Grip strength: Right: 30 lbs; Left: 32 with pain lbs, Lateral pinch: Right: 10 lbs, Left: 2 with pain lbs, and 3 point pinch: Right: 10 lbs, Left: 3 with pain lbs with ADD of proximal thumb and deviation of middle and IP -  08/04/23 : Grip strength:  Right: 35 lbs; Left: 32 with pain lbs, Lateral pinch: Right: 10 lbs, Left: 4 with pain lbs, and 3 point pinch: Right: 10 lbs, Left: 4 with pain lbs Less pain than last visit 09/01/23 : Grip strength: Right: 35 lbs; Left: 31 with pain lbs, Lateral pinch: Right: 10 lbs, Left: 5 lbs, and 3 point pinch: Right: 10 lbs, Left: 5 with pain lbs Less pain then last 2 x - mostly with 3 point COORDINATION: At eval- Impaired fine motor and gross motor coordination secondary to arthritis and decreased hand manipulation skills- and thumb ulnar deviation and ADD of proximal thumb   SENSATION: Pt has a history of Raynaud's disease, denies any numbness  EDEMA: mild edema present in left hand and thumb area.    COGNITION: Overall cognitive status: Within functional limits for tasks assessed   TREATMENT DATE: 09/01/2023                                                                                                                            ADL: Patient reports she is not having any trouble with bathing and dressing, grooming, eating, medication management as well as laundry, like cooking in the kitchen making her bed.   Pt  to cont with  activity modifications and use of adaptive equipment to assist with ADL/IADL tasks.  Pt educated in the past on button hook, larger grips, gripping patterns, moving of items in kitchen and other select equipment to modify tasks.   Patient did report she got some Dycem to help her with gripping and opening large jars.  Also  got foam grips to enlarge her handles all grips   Patient arrived after using CMC neoprene splint 2 months  Patient was seen about a month ago  patient with maintained her progress with thumb palmar radial abduction as well as ability to do precision pinch as well as picking up small objects and manipulating it from palms to fingertips fingertips to palm   Patient able to do 2 point and 3 point pinch picking up small objects as well as paperclip. Able to open plastic bags initiating precision grip with no splint on. Patient is able to maintain oval with some palmar abduction with 2 and 3-point pinch. Patient show this date increased 3 point and lateral pinch  Patient continues to show isolated IP flexion during a 2 and 3-point pinch that she could not do at the evaluation Encourage patient to continue to wear CMC neoprene to facilitate proximal thumb into some abduction.  For her to maintain webspace.    This date patient was able to open small packages and pick up her pills, paperclips. Patient could also hold a cylinder object to open it with the right hand. Doing above without any splints. Patient was able to cut simulated meat holding a knife and fork with CMC neoprene in the past Recommend for patient to sleep with CMC neoprene and use and gross motor activities l  PATIENT EDUCATION: Education details: gripping patterns, adaptive equipment, activity modifications.  And splint use Person educated: Patient Education method: Explanation and Demonstration Education comprehension: verbalized understanding and needs further education    GOALS: Goals reviewed with patient? Yes  SHORT TERM GOALS: Target date: 07/16/2023  Pt will demonstrate understanding of modifications to gripping patterns and use of built up handles for ADL and IADL tasks. Baseline: no knowledge at eval  Goal status: Met   LONG TERM GOALS: Target date: 08/21/2023  Pt will demonstrate managing eye drops  with modified independence. Baseline: difficulty with squeezing eye drop bottles, which is used multiple times a day  Goal status: MET  2.  Pt will demonstrate modifications to managing toothpaste tube for oral hygiene/care. Baseline: difficulty with squeezing toothpaste tube. Goal status: MET  3.  Pt will demonstrate ability to manage buttons, snaps and zippers with modified techniques/use of adaptive equipment for greater independence.  Baseline: difficulty with managing buttons at eval  Goal status:MET  4.  Pt will demonstrate improved left hand functional to pick up and manage a cup in her left hand for drinking. Baseline: difficulty holding cup in left hand at eval. Goal status: MET  ASSESSMENT:  CLINICAL IMPRESSION: Patient is a 88 y.o. female who was seen for occupational therapy  for left gamekeeper's thumb. Pt lives alone, she has been modified independent with basic self care tasks and select IADLs.  She does not drive and is dependent on public transportation to get to medical appts.  She has attempted to modify some tasks such as using an electric can opener, bottle opener and using microwave more for meals than managing/lifting pots and pans.  She presents with pain in left thumb and hand, decreased grip/pinch and hand function on left, decreased ability to perform self care and IADL tasks.  CMC neoprene splint provide patient with a little more webspace abduction of thumb and patient to sleep with it  to maintain webspace and use with gross motor activities.   MC block hand-based splint to allow more IP imprecision pinch.  First follow-up  patient showed increased 2 and 3 point pinch.  Able to pick up small objects, opening packages, opening jars and bottles with no splint.  Patient able to maintain partial palmar abduction during 2 and 3 point pinch.  Patient showed increased 3-point pinch and lateral pinch with no splints.  Today if patient followed up after not being seen for a  month.  Patient was able to maintain progress and independence using left thumb in ADLs and IADLs.  Patient to show even increased to 3 point pinch.  And independence and modified independence in ADLs IADLs.  At this time patient was encouraged to wear CMC neoprene at nighttime and during the day some to maintain her webspace and palmar radial abduction as well as with functional use.  Patient can continue with home program to maintain range of motion and strength and independence.  Met goals  PERFORMANCE DEFICITS: in functional skills including ADLs, IADLs, coordination, dexterity, sensation, edema, ROM, strength, pain, flexibility, Fine motor control, Gross motor control, decreased knowledge of use of DME, and UE functional use and psychosocial skills including environmental adaptation, habits, and routines and behaviors.   IMPAIRMENTS: are limiting patient from ADLs, IADLs, rest and sleep, and social participation.   COMORBIDITIES: has co-morbidities such as Raynaud's disease, arthritis, eye impairments with calcium accumulation that affects occupational performance. Patient will benefit from skilled OT to address above impairments and improve overall function.  MODIFICATION OR ASSISTANCE TO COMPLETE EVALUATION: Min-Moderate modification of tasks or assist with assess necessary to complete an evaluation.  OT OCCUPATIONAL PROFILE AND HISTORY: Comprehensive assessment: Review of records and extensive additional review of physical, cognitive, psychosocial history related to current functional performance.  CLINICAL DECISION MAKING: Moderate - several treatment options, min-mod task modification necessary  REHAB POTENTIAL: Fair pt not a surgical candidate at this time per MD, needs adaptive devices   EVALUATION COMPLEXITY: Moderate      PLAN:  OT FREQUENCY: 1 x wk   OT DURATION:1 wks  PLANNED INTERVENTIONS: 97168 OT Re-evaluation, 97535 self care/ADL training, 09811 therapeutic exercise,  97530 therapeutic activity, 97112 neuromuscular re-education, 97140 manual therapy, 97035 ultrasound, 97018 paraffin, 91478 fluidotherapy, 97010 moist heat, 97010 cryotherapy, 97034 contrast bath, 97760 Orthotics management and training, 29562 Splinting (initial encounter), passive range of motion, coping strategies training, patient/family education, and DME and/or AE instructions  RECOMMENDED OTHER SERVICES: none currently.  CONSULTED AND AGREED WITH PLAN OF CARE: Patient  PLAN FOR NEXT SESSION: provide additional education on adaptive devices, modifications for daily tasks, assess for splinting needs.   Sary Bogie du Preez OTR/L, CLT 09/01/2023, 2:33 PM

## 2023-10-16 NOTE — Progress Notes (Signed)
 Referring Physician:  Rudolpho Norleen BIRCH, MD [address] [facility] [place],  [place] [address]  Primary Physician:  Rudolpho Norleen BIRCH, MD  History of Present Illness: Kelsey Quinn has a history of hypothyroidism, hypercholesterolemia, HTN, Raynaud's, CAD, eye inflammatory disease, detached retina, and osteoporosis.   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   Last seen by me on 08/25/23 for LBP and bilateral leg pain. 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 She has known moderate central stenosis L3-L4 and severe central stenosis L4-L5.    Symptoms with standing/walking are likely from spinal stenosis.   She was to continue with PT at [facility] and we discussed possible surgery options if no improvement with PT.   She is here for follow up.   She has good days and bad days. She still has intermittent LBP with intermittent left > right leg pain posterior to her feet. Pain is better with sitting. Pain is really only with standing and walking. She has intermittent numbness and weakness in her legs. Grocery cart helps.   She does not smoke.   Bowel/Bladder Dysfunction: none. Sees GI for collagenous colitis.   Conservative measures:  Physical therapy: initial PT evaluation at [facility] on 08/06/23 and had 10 visits through 10/08/23 Multimodal medical therapy including regular antiinflammatories: none  Injections:  04/21/2023: Left L5-S1 transforaminal ESI (50% Celestone 9 mg) 03/05/2023: Left L5-S1 transforaminal ESI (75% relief for 3 days then gradual return of pain) 02/14/2022: Left L5-S1 transforaminal ESI (50% relief)   Past Surgery: No spinal surgeries.   Kelsey Quinn has no symptoms of cervical myelopathy.  The symptoms are causing a significant impact on the patient's life.   Review of Systems:  A 10 point review of systems is negative, except for the pertinent positives  and negatives detailed in the HPI.  Past Medical History: Past Medical History:  Diagnosis Date   Cancer (HCC)    Skin CA resected from Left leg.    Dyspnea    Hyperlipidemia    Hypertension    Hypothyroidism    Osteoporosis     Past Surgical History: Past Surgical History:  Procedure Laterality Date   COLONOSCOPY  03/19/2017   COLONOSCOPY WITH PROPOFOL  N/A 09/22/2017   Procedure: COLONOSCOPY WITH PROPOFOL ;  Surgeon: Gaylyn Gladis PENNER, MD;  Location: [facility] ENDOSCOPY;  Service: Endoscopy;  Laterality: N/A;   CORNEAL CHELATION     detached retina   ESOPHAGOGASTRODUODENOSCOPY (EGD) WITH PROPOFOL  N/A 09/22/2017   Procedure: ESOPHAGOGASTRODUODENOSCOPY (EGD) WITH PROPOFOL ;  Surgeon: Gaylyn Gladis PENNER, MD;  Location: [facility] ENDOSCOPY;  Service: Endoscopy;  Laterality: N/A;   EYE SURGERY     cataract   TRIGGER FINGER RELEASE Right 11/15/2012    Allergies: Allergies as of 10/27/2023 - Review Complete 10/27/2023  Allergen Reaction Noted   Sulfa antibiotics Rash 08/20/2017    Medications: Outpatient Encounter Medications as of 10/27/2023  Medication Sig   Acetaminophen 500 MG capsule Take by mouth.   amLODipine (NORVASC) 2.5 MG tablet Take 2.5 mg by mouth daily.   budesonide (ENTOCORT EC) 3 MG 24 hr capsule Take 6 mg by mouth every morning.   Calcium Carb-Cholecalciferol (CALCIUM-VITAMIN D) 500-400 MG-UNIT TABS Take 1 tablet by mouth daily.   dicyclomine (BENTYL) 10 MG capsule Take 10 mg by mouth 3 (three) times daily as needed for spasms.   GLUCOSAMINE-CHONDROIT-VIT C-MN PO Take 1 tablet by mouth daily.  levothyroxine (SYNTHROID, LEVOTHROID) 100 MCG tablet Take 75 mcg by mouth daily before breakfast.   losartan (COZAAR) 50 MG tablet Take 50 mg by mouth daily.   Melatonin 3 MG TABS Take 1 tablet by mouth as needed.   Methylcellulose, Laxative, 500 MG TABS Take 500 mg by mouth.   metoprolol tartrate (LOPRESSOR) 25 MG tablet Take 12.5 mg by mouth 2 (two) times daily.   Multiple  Vitamins-Minerals (CENTRUM SILVER PO) Take 1 tablet by mouth.   potassium chloride SA (KLOR-CON M) 20 MEQ tablet Take 20 mEq by mouth.   Probiotic Product (ALIGN) 4 MG CAPS Take 1 capsule by mouth daily.   RABEprazole (ACIPHEX) 20 MG tablet Take 20 mg by mouth daily.   [DISCONTINUED] timolol (BETIMOL) 0.5 % ophthalmic solution Place 1 drop into both eyes 2 (two) times daily.   No facility-administered encounter medications on file as of 10/27/2023.    Social History: Social History   Tobacco Use   Smoking status: Former    Current packs/day: 0.00    Types: Cigarettes    Quit date: 1996    Years since quitting: 29.5   Smokeless tobacco: Never  Substance Use Topics   Alcohol use: Yes    Family Medical History: Family History  Problem Relation Age of Onset   Breast cancer Mother 27   Skin cancer Sister 53       1/2 sister maternal side    Physical Examination: Vitals:   10/27/23 1436  BP: 100/62       Awake, alert, oriented to person, place, and time.  Speech is clear and fluent. Fund of knowledge is appropriate.   Cranial Nerves: Pupils equal round and reactive to light.  Facial tone is symmetric.    No abnormal lesions on exposed skin.   Strength: Side Iliopsoas Quads Hamstring PF DF EHL  R 5 5 5 5 5 5   L 5 5 5 5 5 5    Reflexes are 1+ and symmetric at the patella and achilles.    Clonus is not present.   Bilateral lower extremity sensation is intact to light touch.     No pain with IR/ER of both hips.   She ambulates with walker. Has slow gait.   Medical Decision Making  Imaging: None   Assessment and Plan: Kelsey Quinn has intermittent LBP with intermittent left > right leg pain posterior to her feet. Pain is really only with standing and walking. She has intermittent numbness and weakness in her legs. Grocery cart helps.   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   She has known moderate central  stenosis L3-L4 and severe central stenosis L4-L5.   Symptoms with standing/walking is likely from spinal stenosis.   Treatment options discussed with patient and following plan made:   - She has failed conservative treatment including time, medications, and PT.  - Follow up with Dr. Clois to discuss possible surgery options.  - Continue with HEP from PT.   I spent a total of 20 minutes in face-to-face and non-face-to-face activities related to this patient's care today including review of outside records, review of imaging, review of symptoms, physical exam, discussion of differential diagnosis, discussion of treatment options, and documentation.   Glade Boys PA-C Dept. of Neurosurgery

## 2023-10-27 ENCOUNTER — Encounter: Payer: Self-pay | Admitting: Orthopedic Surgery

## 2023-10-27 ENCOUNTER — Ambulatory Visit: Admitting: Orthopedic Surgery

## 2023-10-27 VITALS — BP 100/62 | Ht 63.0 in | Wt 81.0 lb

## 2023-10-27 DIAGNOSIS — G96191 Perineural cyst: Secondary | ICD-10-CM

## 2023-10-27 DIAGNOSIS — M5416 Radiculopathy, lumbar region: Secondary | ICD-10-CM

## 2023-10-27 DIAGNOSIS — M47816 Spondylosis without myelopathy or radiculopathy, lumbar region: Secondary | ICD-10-CM

## 2023-10-27 DIAGNOSIS — M48061 Spinal stenosis, lumbar region without neurogenic claudication: Secondary | ICD-10-CM | POA: Diagnosis not present

## 2023-10-27 NOTE — Patient Instructions (Signed)
 It was so nice to see you today. Thank you so much for coming in.    You have wear and tear in your back along with spinal stenosis (pressure on spinal cord).   I want you to continue with your exercises from PT.   You have an appointment with Dr. Clois to discuss further options including possible surgery.   Please do not hesitate to call if you have any questions or concerns. You can also message me in MyChart.   Kelsey Boys PA-C [phone]     The physicians and staff at [facility] Neurosurgery at [facility] are committed to providing excellent care. You may receive a survey asking for feedback about your experience at our office. We value you your feedback and appreciate you taking the time to to fill it out. The [facility] leadership team is also available to discuss your experience in person, feel free to contact us  [phone].

## 2023-11-06 DIAGNOSIS — E7800 Pure hypercholesterolemia, unspecified: Secondary | ICD-10-CM | POA: Insufficient documentation

## 2023-11-06 DIAGNOSIS — E039 Hypothyroidism, unspecified: Secondary | ICD-10-CM | POA: Insufficient documentation

## 2023-11-06 DIAGNOSIS — H25019 Cortical age-related cataract, unspecified eye: Secondary | ICD-10-CM | POA: Insufficient documentation

## 2023-11-06 DIAGNOSIS — I10 Essential (primary) hypertension: Secondary | ICD-10-CM | POA: Insufficient documentation

## 2023-11-06 NOTE — Progress Notes (Addendum)
 Referring Physician:  Rudolpho Norleen BIRCH, MD [address] [facility] [address] [facility] [place],  [place] [address]  Primary Physician:  Rudolpho Norleen BIRCH, MD  History of Present Illness:    11/10/2023 Discussed the use of AI scribe software for clinical note transcription with the patient, who gave verbal consent to proceed.  History of Present Illness Kelsey Quinn is an 88 year old female with lumbar stenosis who presents with back and leg pain.  She experiences left-sided back pain that occasionally radiates down her legs, particularly when standing or walking for extended periods. The leg pain is predominantly on the left side and rarely extends lower. Pain intensity varies from 'fifteen' to 'one' on a pain scale, depending on the time of day.  She uses a walker when going out and sometimes uses a cane or no aid at home, indicating variability in her mobility. She does not drive and relies on a bus or her brother-in-law for transportation. Her groceries are delivered to her daughter, and she does not perform errands herself.  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 She experiences tightness in her spine.      10/27/2023 Note from Glade Boys, PA-C Ms. Emmalyn Hinson has a history of hypothyroidism, hypercholesterolemia, HTN, Raynaud's, CAD, eye inflammatory disease, detached retina, and osteoporosis.    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    Last seen by me on 08/25/23 for LBP and bilateral leg pain. 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 She has known moderate central stenosis L3-L4 and severe central stenosis L4-L5.    Symptoms with standing/walking are likely from spinal stenosis.    She was to continue with PT at [facility] and we discussed possible surgery options if no improvement with PT.    She is here for follow up.     She has good days and bad days. She still has intermittent LBP with intermittent left > right leg pain posterior to her feet. Pain is better with sitting. Pain is really only with standing and walking. She has intermittent numbness and weakness in her legs. Grocery cart helps.    She does not smoke.    Bowel/Bladder Dysfunction: none. Sees GI for collagenous colitis.    Conservative measures:  Physical therapy: initial PT evaluation at [facility] on 08/06/23 and had 10 visits through 10/08/23 Multimodal medical therapy including regular antiinflammatories: none  Injections:  04/21/2023: Left L5-S1 transforaminal ESI (50% Celestone 9 mg) 03/05/2023: Left L5-S1 transforaminal ESI (75% relief for 3 days then gradual return of pain) 02/14/2022: Left L5-S1 transforaminal ESI (50% relief)    Past Surgery: No spinal surgeries.   [name] has no symptoms of cervical myelopathy.  The symptoms are causing a significant impact on the patient's life.   I have utilized the care everywhere function in epic to review the outside records available from external health systems.  Review of Systems:  A 10 point review of systems is negative, except for the pertinent positives and negatives detailed in the HPI.  Past Medical History: Past Medical History:  Diagnosis Date   Cancer (HCC)    Skin CA resected from Left leg.    Dyspnea    Hyperlipidemia    Hypertension    Hypothyroidism    Osteoporosis     Past Surgical History: Past Surgical History:  Procedure Laterality  Date   COLONOSCOPY  03/19/2017   COLONOSCOPY WITH PROPOFOL  N/A 09/22/2017   Procedure: COLONOSCOPY WITH PROPOFOL ;  Surgeon: Gaylyn Gladis PENNER, MD;  Location: [facility] ENDOSCOPY;  Service: Endoscopy;  Laterality: N/A;   CORNEAL CHELATION     detached retina   ESOPHAGOGASTRODUODENOSCOPY (EGD) WITH PROPOFOL  N/A 09/22/2017   Procedure: ESOPHAGOGASTRODUODENOSCOPY (EGD) WITH PROPOFOL ;  Surgeon: Gaylyn Gladis PENNER, MD;  Location: [facility]  ENDOSCOPY;  Service: Endoscopy;  Laterality: N/A;   EYE SURGERY     cataract   TRIGGER FINGER RELEASE Right 11/15/2012    Allergies: Allergies as of 11/10/2023 - Review Complete 11/10/2023  Allergen Reaction Noted   Sulfa antibiotics Rash 08/20/2017    Medications:  Current Outpatient Medications:    Acetaminophen 500 MG capsule, Take by mouth., Disp: , Rfl:    amLODipine (NORVASC) 2.5 MG tablet, Take 2.5 mg by mouth daily., Disp: , Rfl:    budesonide (ENTOCORT EC) 3 MG 24 hr capsule, Take 6 mg by mouth every morning., Disp: , Rfl:    Calcium Carb-Cholecalciferol (CALCIUM-VITAMIN D) 500-400 MG-UNIT TABS, Take 1 tablet by mouth daily., Disp: , Rfl:    dicyclomine (BENTYL) 10 MG capsule, Take 10 mg by mouth 3 (three) times daily as needed for spasms., Disp: , Rfl:    GLUCOSAMINE-CHONDROIT-VIT C-MN PO, Take 1 tablet by mouth daily., Disp: , Rfl:    levothyroxine (SYNTHROID, LEVOTHROID) 100 MCG tablet, Take 75 mcg by mouth daily before breakfast., Disp: , Rfl:    losartan (COZAAR) 50 MG tablet, Take 50 mg by mouth daily., Disp: , Rfl:    Melatonin 3 MG TABS, Take 1 tablet by mouth as needed., Disp: , Rfl:    Methylcellulose, Laxative, 500 MG TABS, Take 500 mg by mouth., Disp: , Rfl:    metoprolol tartrate (LOPRESSOR) 25 MG tablet, Take 12.5 mg by mouth 2 (two) times daily., Disp: , Rfl:    Multiple Vitamins-Minerals (CENTRUM SILVER PO), Take 1 tablet by mouth., Disp: , Rfl:    potassium chloride SA (KLOR-CON M) 20 MEQ tablet, Take 20 mEq by mouth., Disp: , Rfl:    Probiotic Product (ALIGN) 4 MG CAPS, Take 1 capsule by mouth daily., Disp: , Rfl:    RABEprazole (ACIPHEX) 20 MG tablet, Take 20 mg by mouth daily., Disp: , Rfl:   Social History: Social History   Tobacco Use   Smoking status: Former    Current packs/day: 0.00    Types: Cigarettes    Quit date: 1996    Years since quitting: 29.5   Smokeless tobacco: Never  Substance Use Topics   Alcohol use: Yes    Family Medical  History: Family History  Problem Relation Age of Onset   Breast cancer Mother 53   Skin cancer Sister 60       1/2 sister maternal side    Physical Examination: Vitals:   11/10/23 1414  BP: 128/68    General: Patient is in no apparent distress. Attention to examination is appropriate.  Neck:   Supple.  Full range of motion.  Respiratory: Patient is breathing without any difficulty.   NEUROLOGICAL:     Awake, alert, oriented to person, place, and time.  Speech is clear and fluent.   Cranial Nerves: Legally blind.  Facial tone is symmetric.  Facial sensation is symmetric. Shoulder shrug is symmetric. Tongue protrusion is midline.    Strength: Side Biceps Triceps Deltoid Interossei Grip Wrist Ext. Wrist Flex.  R 5 5 4+ 5 5 5 5   L 5  5 5 5 5 5 5    Side Iliopsoas Quads Hamstring PF DF EHL  R 5 5 5 5 5 5   L 5 5 5 5 5 5    Reflexes are 1+ and symmetric at the biceps, triceps, brachioradialis, patella and achilles.   Hoffman's is absent.   Bilateral upper and lower extremity sensation is intact to light touch.    No evidence of dysmetria noted.  Gait is abnormal and requires a walker.     Medical Decision Making  Imaging: MRI L spine 07/09/2023 IMPRESSION: 1. Scoliotic curvature. 2. L3-4: Moderate bulging of the disc. Facet and ligamentous hypertrophy. Moderate multifactorial stenosis with crowding of the nerve roots. Neural compression could occur at this level. Slight worsening since the prior exam. 3. L4-5: Broad-based protrusion of the disc. Pronounced facet and ligamentous hypertrophy. Severe multifactorial stenosis that could be symptomatic on either or both sides. Similar appearance to the study of 2023. 4. L5-S1: Disc bulge and facet osteoarthritis. No compressive stenosis.     Electronically Signed   By: Oneil Officer M.D.   On: 08/11/2023 07:34  I have personally reviewed the images and agree with the above interpretation.  Assessment and Plan: Ms.  Quinn is a pleasant 88 y.o. female with neurogenic claudication.  Lumbar spinal stenosis with neurogenic claudication Chronic lumbar spinal stenosis with neurogenic claudication, primarily left-sided. Symptoms include intermittent back pain radiating to legs, managed with a walker and cane. Conservative management due to minimal impact on quality of life. - Schedule follow-up in a few months to reassess. - Advise monitoring symptoms and report significant changes. - Discuss potential future intervention if symptoms worsen. - Option to contact Dr. Sharrie for further evaluation.    I spent a total of 30 minutes in this patient's care today. This time was spent reviewing pertinent records including imaging studies, obtaining and confirming history, performing a directed evaluation, formulating and discussing my recommendations, and documenting the visit within the medical record.      Thank you for involving me in the care of this patient.      General Wearing K. Clois MD, [facility] Neurosurgery

## 2023-11-10 ENCOUNTER — Ambulatory Visit: Admitting: Neurosurgery

## 2023-11-10 VITALS — BP 128/68 | Ht 63.0 in | Wt 81.0 lb

## 2023-11-10 DIAGNOSIS — M48062 Spinal stenosis, lumbar region with neurogenic claudication: Secondary | ICD-10-CM | POA: Diagnosis not present

## 2023-11-10 DIAGNOSIS — M48061 Spinal stenosis, lumbar region without neurogenic claudication: Secondary | ICD-10-CM

## 2024-02-07 NOTE — Progress Notes (Unsigned)
 Referring Physician:  Rudolpho Norleen BIRCH, MD [address] [facility] [address] [facility] [place],  [place] [address]  Primary Physician:  Rudolpho Norleen BIRCH, MD  History of Present Illness: [name] has a history of hypothyroidism, hypercholesterolemia, HTN, Raynaud's, CAD, eye inflammatory disease, detached retina, and osteoporosis.   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   Last seen by Dr. Clois on 11/10/23. She has known multiple root sleeve cysts in lower thoracic and lumbar region along with tarlov cysts. She also has known moderate central stenosis L3-L4 and severe central stenosis L4-L5.   He recommended conservative management due to minimal impact of her symptoms on her quality of life.   She has good days and bad days. She still has intermittent LBP with intermittent left > right leg pain to her knee (whole leg). She has no pain with sitting. Pain is really only with standing and walking. She has weakness in her legs left > right. Grocery cart helps.   She does not smoke.   Bowel/Bladder Dysfunction: none. Sees GI for collagenous colitis.   Conservative measures:  Physical therapy: initial PT evaluation at [facility] on 08/06/23 and had 10 visits through 10/08/23 Multimodal medical therapy including regular antiinflammatories: none  Injections:  04/21/2023: Left L5-S1 transforaminal ESI (50% Celestone 9 mg) 03/05/2023: Left L5-S1 transforaminal ESI (75% relief for 3 days then gradual return of pain) 02/14/2022: Left L5-S1 transforaminal ESI (50% relief)   Past Surgery: No spinal surgeries.   Athira E Alamillo has no symptoms of cervical myelopathy.  The symptoms are causing a significant impact on the patient's life.   Review of Systems:  A 10 point review of systems is negative, except for the pertinent positives and negatives detailed in the HPI.  Past Medical History: Past Medical History:  Diagnosis Date   Cancer (HCC)     Skin CA resected from Left leg.    Dyspnea    Hyperlipidemia    Hypertension    Hypothyroidism    Osteoporosis     Past Surgical History: Past Surgical History:  Procedure Laterality Date   COLONOSCOPY  03/19/2017   COLONOSCOPY WITH PROPOFOL  N/A 09/22/2017   Procedure: COLONOSCOPY WITH PROPOFOL ;  Surgeon: Gaylyn Gladis PENNER, MD;  Location: [facility] ENDOSCOPY;  Service: Endoscopy;  Laterality: N/A;   CORNEAL CHELATION     detached retina   ESOPHAGOGASTRODUODENOSCOPY (EGD) WITH PROPOFOL  N/A 09/22/2017   Procedure: ESOPHAGOGASTRODUODENOSCOPY (EGD) WITH PROPOFOL ;  Surgeon: Gaylyn Gladis PENNER, MD;  Location: [facility] ENDOSCOPY;  Service: Endoscopy;  Laterality: N/A;   EYE SURGERY     cataract   TRIGGER FINGER RELEASE Right 11/15/2012    Allergies: Allergies as of 02/11/2024 - Review Complete 02/11/2024  Allergen Reaction Noted   Sulfa antibiotics Rash 08/20/2017    Medications: Outpatient Encounter Medications as of 02/11/2024  Medication Sig   Acetaminophen 500 MG capsule Take by mouth.   amLODipine (NORVASC) 2.5 MG tablet Take 2.5 mg by mouth daily.   budesonide (ENTOCORT EC) 3 MG 24 hr capsule Take 6 mg by mouth every morning.   Calcium Carb-Cholecalciferol (CALCIUM-VITAMIN D) 500-400 MG-UNIT TABS Take 1 tablet by mouth daily.   dicyclomine (BENTYL) 10 MG capsule Take 10 mg by mouth 3 (three) times daily as needed for spasms.   GLUCOSAMINE-CHONDROIT-VIT C-MN PO Take 1 tablet by mouth daily.   levothyroxine (SYNTHROID, LEVOTHROID) 100 MCG tablet Take 75 mcg by mouth daily before breakfast.   losartan (COZAAR) 50 MG tablet Take  50 mg by mouth daily.   Melatonin 3 MG TABS Take 1 tablet by mouth as needed.   Methylcellulose, Laxative, 500 MG TABS Take 500 mg by mouth.   metoprolol tartrate (LOPRESSOR) 25 MG tablet Take 12.5 mg by mouth 2 (two) times daily.   Multiple Vitamins-Minerals (CENTRUM SILVER PO) Take 1 tablet by mouth.   potassium chloride SA (KLOR-CON M) 20 MEQ tablet Take 20  mEq by mouth.   Probiotic Product (ALIGN) 4 MG CAPS Take 1 capsule by mouth daily.   RABEprazole (ACIPHEX) 20 MG tablet Take 20 mg by mouth daily.   No facility-administered encounter medications on file as of 02/11/2024.    Social History: Social History   Tobacco Use   Smoking status: Former    Current packs/day: 0.00    Types: Cigarettes    Quit date: 1996    Years since quitting: 29.8   Smokeless tobacco: Never  Substance Use Topics   Alcohol use: Yes    Family Medical History: Family History  Problem Relation Age of Onset   Breast cancer Mother 68   Skin cancer Sister 45       1/2 sister maternal side    Physical Examination: Vitals:   02/11/24 1022  BP: 114/62    Awake, alert, oriented to person, place, and time.  Speech is clear and fluent. Fund of knowledge is appropriate.   Cranial Nerves: Pupils equal round and reactive to light.  Facial tone is symmetric.    No abnormal lesions on exposed skin.   Strength: Side Iliopsoas Quads Hamstring PF DF EHL  R 5 5 5 5 5 5   L 5 5 5 5 5 5    Reflexes are 1+ and symmetric at the patella and achilles.    Clonus is not present.   Bilateral lower extremity sensation is intact to light touch.     No pain with IR/ER of both hips.   She ambulates with walker. Has slow gait.   Medical Decision Making  Imaging: None   Assessment and Plan: Ms. Rubendall continues with good days and bad days. She still has intermittent LBP with intermittent left > right leg pain to her knee (whole leg). She has no pain with sitting. Pain is really only with standing and walking. She has weakness in her legs left > right.   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   She has known moderate central stenosis L3-L4 and severe central stenosis L4-L5.   Treatment options discussed with patient and following plan made:   - She will continue with activity as tolerated.  - She would  like to follow up at after the first of the year. She is not ready to discuss surgery as symptoms are still intermittent.   I spent a total of 15 minutes in face-to-face and non-face-to-face activities related to this patient's care today including review of outside records, review of imaging, review of symptoms, physical exam, discussion of differential diagnosis, discussion of treatment options, and documentation.   Glade Boys PA-C Dept. of Neurosurgery

## 2024-02-10 ENCOUNTER — Ambulatory Visit: Admitting: Orthopedic Surgery

## 2024-02-11 ENCOUNTER — Encounter: Payer: Self-pay | Admitting: Orthopedic Surgery

## 2024-02-11 ENCOUNTER — Ambulatory Visit: Admitting: Orthopedic Surgery

## 2024-02-11 VITALS — BP 114/62 | Ht 63.0 in | Wt 81.0 lb

## 2024-02-11 DIAGNOSIS — M48061 Spinal stenosis, lumbar region without neurogenic claudication: Secondary | ICD-10-CM | POA: Diagnosis not present

## 2024-02-11 DIAGNOSIS — G96191 Perineural cyst: Secondary | ICD-10-CM

## 2024-02-11 DIAGNOSIS — R29898 Other symptoms and signs involving the musculoskeletal system: Secondary | ICD-10-CM | POA: Diagnosis not present

## 2024-02-11 DIAGNOSIS — M47816 Spondylosis without myelopathy or radiculopathy, lumbar region: Secondary | ICD-10-CM

## 2024-02-11 DIAGNOSIS — M5416 Radiculopathy, lumbar region: Secondary | ICD-10-CM

## 2024-04-17 NOTE — Progress Notes (Signed)
 "  Referring Physician:  Rudolpho Norleen BIRCH, MD [address] [facility] [address] [facility] [place],  [place] [address]  Primary Physician:  Kelsey Norleen BIRCH, MD  History of Present Illness: Kelsey Quinn has a history of hypothyroidism, hypercholesterolemia, HTN, Raynaud's, CAD, eye inflammatory disease, detached retina, and osteoporosis.   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   Last seen by me on 02/11/24. She has known multiple root sleeve cysts in lower thoracic and lumbar region along with tarlov cysts. She also has known moderate central stenosis L3-L4 and severe central stenosis L4-L5.   Dr. Clois previoulsy recommended conservative management due to minimal impact of her symptoms on her quality of life.   She is here for follow up.   She has good days and bad days. She continues with intermittent LBP with intermittent left > right leg pain to her knee (whole leg). She has no pain with sitting. Pain is really only with standing and walking. Grocery cart helps.   She does not smoke.   Bowel/Bladder Dysfunction: none. Sees GI for collagenous colitis.   Conservative measures:  Physical therapy: initial PT evaluation at [facility] on 08/06/23 and had 10 visits through 10/08/23 Multimodal medical therapy including regular antiinflammatories: none  Injections:  04/21/2023: Left L5-S1 transforaminal ESI (50% Celestone 9 mg) 03/05/2023: Left L5-S1 transforaminal ESI (75% relief for 3 days then gradual return of pain) 02/14/2022: Left L5-S1 transforaminal ESI (50% relief)   Past Surgery: No spinal surgeries.   Kelsey Quinn has no symptoms of cervical myelopathy.  The symptoms are causing a significant impact on the patient's life.   Review of Systems:  A 10 point review of systems is negative, except for the pertinent positives and negatives detailed in the HPI.  Past Medical History: Past Medical History:  Diagnosis Date   Cancer (HCC)     Skin CA resected from Left leg.    Dyspnea    Hyperlipidemia    Hypertension    Hypothyroidism    Osteoporosis     Past Surgical History: Past Surgical History:  Procedure Laterality Date   COLONOSCOPY  03/19/2017   COLONOSCOPY WITH PROPOFOL  N/A 09/22/2017   Procedure: COLONOSCOPY WITH PROPOFOL ;  Surgeon: Kelsey Gladis PENNER, MD;  Location: [facility] ENDOSCOPY;  Service: Endoscopy;  Laterality: N/A;   CORNEAL CHELATION     detached retina   ESOPHAGOGASTRODUODENOSCOPY (EGD) WITH PROPOFOL  N/A 09/22/2017   Procedure: ESOPHAGOGASTRODUODENOSCOPY (EGD) WITH PROPOFOL ;  Surgeon: Kelsey Gladis PENNER, MD;  Location[facility] ENDOSCOPY;  Service: Endoscopy;  Laterality: N/A;   EYE SURGERY     cataract   TRIGGER FINGER RELEASE Right 11/15/2012    Allergies: Allergies as of 04/21/2024 - Review Complete 04/21/2024  Allergen Reaction Noted   Sulfa antibiotics Rash 08/20/2017    Medications: Outpatient Encounter Medications as of 04/21/2024  Medication Sig   Acetaminophen 500 MG capsule Take by mouth.   amLODipine (NORVASC) 2.5 MG tablet Take 2.5 mg by mouth daily.   budesonide (ENTOCORT EC) 3 MG 24 hr capsule Take 6 mg by mouth every morning.   Calcium Carb-Cholecalciferol (CALCIUM-VITAMIN D) 500-400 MG-UNIT TABS Take 1 tablet by mouth daily.   dicyclomine (BENTYL) 10 MG capsule Take 10 mg by mouth 3 (three) times daily as needed for spasms.   GLUCOSAMINE-CHONDROIT-VIT C-MN PO Take 1 tablet by mouth daily.   ketorolac (ACULAR) 0.4 % SOLN Place 1 drop into the left eye 2 (two) times daily.   levothyroxine (SYNTHROID) 75  MCG tablet Take by mouth.   losartan (COZAAR) 50 MG tablet Take 50 mg by mouth daily.   Melatonin 3 MG TABS Take 1 tablet by mouth as needed.   Methylcellulose, Laxative, 500 MG TABS Take 500 mg by mouth.   metoprolol tartrate (LOPRESSOR) 25 MG tablet Take 12.5 mg by mouth 2 (two) times daily.   Multiple Vitamins-Minerals (CENTRUM SILVER PO) Take 1 tablet by mouth.   potassium  chloride SA (KLOR-CON M) 20 MEQ tablet Take 20 mEq by mouth.   Probiotic Product (ALIGN) 4 MG CAPS Take 1 capsule by mouth daily.   RABEprazole (ACIPHEX) 20 MG tablet Take 20 mg by mouth daily.   [DISCONTINUED] levothyroxine (SYNTHROID, LEVOTHROID) 100 MCG tablet Take 75 mcg by mouth daily before breakfast.   No facility-administered encounter medications on file as of 04/21/2024.    Social History: Social History   Tobacco Use   Smoking status: Former    Current packs/day: 0.00    Types: Cigarettes    Quit date: 1996    Years since quitting: 30.0   Smokeless tobacco: Never  Substance Use Topics   Alcohol use: Yes    Family Medical History: Family History  Problem Relation Age of Onset   Breast cancer Mother 16   Skin cancer Sister 3       1/2 sister maternal side    Physical Examination: Vitals:   04/21/24 1323  BP: 126/62    Awake, alert, oriented to person, place, and time.  Speech is clear and fluent. Fund of knowledge is appropriate.   Cranial Nerves: Pupils equal round and reactive to light.  Facial tone is symmetric.    No abnormal lesions on exposed skin.   Strength: Side Iliopsoas Quads Hamstring PF DF EHL  R 5 5 5 5 5 5   L 5 5 5 5 5 5    Clonus is not present.   Bilateral lower extremity sensation is intact to light touch.     No pain with IR/ER of both hips.   She ambulates with walker. Has slow gait.   Medical Decision Making  Imaging: None   Assessment and Plan: Kelsey Quinn continues with good days and bad days. She still has intermittent LBP with intermittent left > right leg pain to her knee (whole leg). She has no pain with sitting. Pain is really only with standing and walking.   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   She has known moderate central stenosis L3-L4 and severe central stenosis L4-L5.   Treatment options discussed with patient and following plan made:   - She  will continue with activity as tolerated.  - She would like to follow up in 4 months. She is not ready to discuss surgery as symptoms are still intermittent.   I spent a total of 15 minutes in face-to-face and non-face-to-face activities related to this patient's care today including review of outside records, review of imaging, review of symptoms, physical exam, discussion of differential diagnosis, discussion of treatment options, and documentation.   Glade Boys PA-C Dept. of Neurosurgery  "

## 2024-04-21 ENCOUNTER — Encounter: Payer: Self-pay | Admitting: Orthopedic Surgery

## 2024-04-21 ENCOUNTER — Ambulatory Visit: Admitting: Orthopedic Surgery

## 2024-04-21 VITALS — BP 126/62 | Ht 63.0 in | Wt 81.0 lb

## 2024-04-21 DIAGNOSIS — M4726 Other spondylosis with radiculopathy, lumbar region: Secondary | ICD-10-CM | POA: Diagnosis not present

## 2024-04-21 DIAGNOSIS — G96191 Perineural cyst: Secondary | ICD-10-CM

## 2024-04-21 DIAGNOSIS — M5416 Radiculopathy, lumbar region: Secondary | ICD-10-CM

## 2024-04-21 DIAGNOSIS — M48061 Spinal stenosis, lumbar region without neurogenic claudication: Secondary | ICD-10-CM

## 2024-04-21 DIAGNOSIS — M47816 Spondylosis without myelopathy or radiculopathy, lumbar region: Secondary | ICD-10-CM

## 2024-08-11 ENCOUNTER — Ambulatory Visit: Admitting: Orthopedic Surgery
